# Patient Record
Sex: Male | Born: 1960 | Race: White | Hispanic: No | Marital: Single | Smoking: Never smoker
Health system: Southern US, Community
[De-identification: ages and names within clinical notes are randomized; demographics above are authoritative.]

## PROBLEM LIST (undated history)

## (undated) DIAGNOSIS — M1990 Unspecified osteoarthritis, unspecified site: Secondary | ICD-10-CM

## (undated) DIAGNOSIS — I10 Essential (primary) hypertension: Secondary | ICD-10-CM

## (undated) DIAGNOSIS — M109 Gout, unspecified: Secondary | ICD-10-CM

## (undated) HISTORY — DX: Essential (primary) hypertension: I10

## (undated) HISTORY — DX: Gout, unspecified: M10.9

## (undated) HISTORY — DX: Unspecified osteoarthritis, unspecified site: M19.90

## (undated) HISTORY — PX: CHOLECYSTECTOMY: SHX55

---

## 2005-07-04 ENCOUNTER — Emergency Department: Payer: Self-pay | Admitting: Emergency Medicine

## 2006-02-22 ENCOUNTER — Inpatient Hospital Stay: Payer: Self-pay | Admitting: Surgery

## 2006-02-22 ENCOUNTER — Other Ambulatory Visit: Payer: Self-pay

## 2006-07-24 ENCOUNTER — Other Ambulatory Visit: Payer: Self-pay

## 2006-07-24 ENCOUNTER — Inpatient Hospital Stay: Payer: Self-pay | Admitting: Surgery

## 2014-03-31 ENCOUNTER — Ambulatory Visit: Payer: Self-pay | Admitting: Internal Medicine

## 2014-04-09 ENCOUNTER — Emergency Department: Payer: Self-pay | Admitting: Emergency Medicine

## 2014-04-09 LAB — URINALYSIS, COMPLETE
BACTERIA: NONE SEEN
BLOOD: NEGATIVE
Bilirubin,UR: NEGATIVE
Glucose,UR: NEGATIVE mg/dL (ref 0–75)
Ketone: NEGATIVE
Leukocyte Esterase: NEGATIVE
NITRITE: NEGATIVE
PH: 6 (ref 4.5–8.0)
Protein: NEGATIVE
RBC,UR: 1 /HPF (ref 0–5)
Specific Gravity: 1.015 (ref 1.003–1.030)
Squamous Epithelial: NONE SEEN
WBC UR: 1 /HPF (ref 0–5)

## 2014-04-11 LAB — URINE CULTURE

## 2014-06-10 ENCOUNTER — Emergency Department: Payer: Self-pay | Admitting: Emergency Medicine

## 2014-06-11 ENCOUNTER — Emergency Department: Payer: Self-pay | Admitting: Emergency Medicine

## 2014-06-13 ENCOUNTER — Emergency Department: Payer: Self-pay | Admitting: Internal Medicine

## 2014-06-16 ENCOUNTER — Emergency Department: Payer: Self-pay | Admitting: Emergency Medicine

## 2015-05-11 ENCOUNTER — Ambulatory Visit
Admission: EM | Admit: 2015-05-11 | Discharge: 2015-05-11 | Disposition: A | Discharge status: Home / Self Care | Payer: Managed Care, Other (non HMO) | Attending: Family Medicine | Admitting: Family Medicine

## 2015-05-11 DIAGNOSIS — J101 Influenza due to other identified influenza virus with other respiratory manifestations: Secondary | ICD-10-CM

## 2015-05-11 LAB — URINALYSIS COMPLETE WITH MICROSCOPIC (ARMC ONLY)
BILIRUBIN URINE: NEGATIVE
Bacteria, UA: NONE SEEN
Glucose, UA: NEGATIVE mg/dL
KETONES UR: NEGATIVE mg/dL
LEUKOCYTES UA: NEGATIVE
NITRITE: NEGATIVE
Protein, ur: NEGATIVE mg/dL
pH: 5.5 (ref 5.0–8.0)

## 2015-05-11 LAB — RAPID INFLUENZA A&B ANTIGENS: Influenza A (ARMC): NOT DETECTED

## 2015-05-11 LAB — RAPID INFLUENZA A&B ANTIGENS (ARMC ONLY): INFLUENZA B (ARMC): DETECTED

## 2015-05-11 MED ORDER — GUAIFENESIN-CODEINE 100-10 MG/5ML PO SOLN: ORAL | Status: DC

## 2015-05-11 MED ORDER — OSELTAMIVIR PHOSPHATE 75 MG PO CAPS: 75.0000 mg | ORAL_CAPSULE | Freq: Two times a day (BID) | ORAL | Status: DC

## 2015-05-11 NOTE — ED Notes (Addendum)
Patient c/o fever, cough, nasal congestion, body aches, and chills which started Thursday.  Patient is also c/o right-sided lower back pain which has been going on for over a year.

## 2015-05-11 NOTE — ED Provider Notes (Signed)
CSN: [id number]     Arrival date & time 05/11/15  1313 History   None    Chief Complaint  Patient presents with  . Cough  . Nasal Congestion   (Consider location/radiation/quality/duration/timing/severity/associated sxs/prior Treatment) Patient is a 55 y.o. male presenting with URI. The history is provided by the patient.  URI Presenting symptoms: congestion, cough, fever and rhinorrhea   Severity:  Moderate Onset quality:  Sudden Duration:  2 days Timing:  Constant Progression:  Worsening Chronicity:  New Relieved by:  Nothing Ineffective treatments:  OTC medications Associated symptoms: headaches and myalgias   Risk factors: no chronic kidney disease, no chronic respiratory disease, no diabetes mellitus and no immunosuppression     History reviewed. No pertinent past medical history. Past Surgical History  Procedure Laterality Date  . Cholecystectomy     History reviewed. No pertinent family history. Social History  Substance Use Topics  . Smoking status: Never Smoker   . Smokeless tobacco: Never Used  . Alcohol Use: Yes     Comment: Occ.    Review of Systems  Constitutional: Positive for fever.  HENT: Positive for congestion and rhinorrhea.   Respiratory: Positive for cough.   Musculoskeletal: Positive for myalgias.  Neurological: Positive for headaches.    Allergies  Review of patient's allergies indicates no known allergies.  Home Medications   Prior to Admission medications   Medication Sig Start Date End Date Taking? Authorizing Provider  guaiFENesin-codeine 100-10 MG/5ML syrup 10 ml po q 8 hours prn cough 05/11/15   Payton Mccallum, MD  oseltamivir (TAMIFLU) 75 MG capsule Take 1 capsule (75 mg total) by mouth 2 (two) times daily. 05/11/15   Payton Mccallum, MD   Meds Ordered and Administered this Visit  Medications - No data to display  BP 133/91 mmHg  Pulse 113  Temp(Src) 99.2 F (37.3 C) (Oral)  Resp 18  Ht  (1.727 m)  Wt 214 lb (97.07 kg)   BMI 32.55 kg/m2  SpO2 98% No data found.   Physical Exam  Constitutional: He appears well-developed and well-nourished. No distress.  HENT:  Head: Normocephalic and atraumatic.  Right Ear: Tympanic membrane, external ear and ear canal normal.  Left Ear: Tympanic membrane, external ear and ear canal normal.  Nose: Rhinorrhea present.  Mouth/Throat: Uvula is midline, oropharynx is clear and moist and mucous membranes are normal. No oropharyngeal exudate or tonsillar abscesses.  Eyes: Conjunctivae and EOM are normal. Pupils are equal, round, and reactive to light. Right eye exhibits no discharge. Left eye exhibits no discharge. No scleral icterus.  Neck: Normal range of motion. Neck supple. No tracheal deviation present. No thyromegaly present.  Cardiovascular: Normal rate, regular rhythm and normal heart sounds.   Pulmonary/Chest: Effort normal and breath sounds normal. No stridor. No respiratory distress. He has no wheezes. He has no rales. He exhibits no tenderness.  Abdominal: Soft. Bowel sounds are normal. He exhibits no distension.  Lymphadenopathy:    He has no cervical adenopathy.  Neurological: He is alert.  Skin: Skin is warm and dry. No rash noted. He is not diaphoretic.  Nursing note and vitals reviewed.   ED Course  Procedures (including critical care time)  Labs Review Labs Reviewed  URINALYSIS COMPLETEWITH MICROSCOPIC ([facility] ONLY) - Abnormal; Notable for the following:    Specific Gravity, Urine >1.030 (*)    Hgb urine dipstick TRACE (*)    Squamous Epithelial / LPF 0-5 (*)    All other components within normal limits  RAPID  INFLUENZA A&B ANTIGENS ([facility] ONLY)    Imaging Review No results found.   Visual Acuity Review  Right Eye Distance:   Left Eye Distance:   Bilateral Distance:    Right Eye Near:   Left Eye Near:    Bilateral Near:         MDM   1. Influenza B     New Prescriptions   GUAIFENESIN-CODEINE 100-10 MG/5ML SYRUP    10 ml po q 8  hours prn cough   OSELTAMIVIR (TAMIFLU) 75 MG CAPSULE    Take 1 capsule (75 mg total) by mouth 2 (two) times daily.   1. Lab results and diagnosis reviewed with patient 2. rx as per orders above; reviewed possible side effects, interactions, risks and benefits  3. Recommend supportive treatment with increased fluids; otc analgesics 4. Follow-up prn if symptoms worsen or don't improve    Payton Mccallum, MD 05/11/15 1447

## 2018-01-05 ENCOUNTER — Ambulatory Visit: Payer: Self-pay

## 2018-01-17 ENCOUNTER — Ambulatory Visit: Payer: Self-pay | Admitting: Family Medicine

## 2018-01-17 ENCOUNTER — Other Ambulatory Visit: Payer: Self-pay

## 2018-01-17 VITALS — BP 157/94 | HR 92 | Ht 67.0 in | Wt 223.5 lb

## 2018-01-17 DIAGNOSIS — G8929 Other chronic pain: Secondary | ICD-10-CM

## 2018-01-17 DIAGNOSIS — R053 Chronic cough: Secondary | ICD-10-CM

## 2018-01-17 DIAGNOSIS — R0609 Other forms of dyspnea: Principal | ICD-10-CM

## 2018-01-17 DIAGNOSIS — R05 Cough: Secondary | ICD-10-CM

## 2018-01-17 DIAGNOSIS — M549 Dorsalgia, unspecified: Secondary | ICD-10-CM

## 2018-01-17 DIAGNOSIS — R030 Elevated blood-pressure reading, without diagnosis of hypertension: Secondary | ICD-10-CM | POA: Insufficient documentation

## 2018-01-17 MED ORDER — FLUTICASONE-SALMETEROL 250-50 MCG/DOSE IN AEPB: 1.0000 | INHALATION_SPRAY | Freq: Two times a day (BID) | RESPIRATORY_TRACT | 3 refills | Status: DC

## 2018-01-17 MED ORDER — DICLOFENAC SODIUM 75 MG PO TBEC: 75.0000 mg | DELAYED_RELEASE_TABLET | Freq: Two times a day (BID) | ORAL | 2 refills | Status: DC | PRN

## 2018-01-17 NOTE — Addendum Note (Signed)
Addended by: Madelin Rear A on: 01/17/2018 07:58 PM   Modules accepted: Orders
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
## 2018-01-19 ENCOUNTER — Ambulatory Visit
Admission: RE | Admit: 2018-01-19 | Discharge: 2018-01-19 | Disposition: A | Discharge status: Home / Self Care | Payer: Self-pay | Source: Ambulatory Visit | Attending: Family Medicine | Admitting: Family Medicine

## 2018-01-19 ENCOUNTER — Other Ambulatory Visit: Payer: Self-pay

## 2018-01-19 DIAGNOSIS — M549 Dorsalgia, unspecified: Secondary | ICD-10-CM

## 2018-01-19 DIAGNOSIS — R0609 Other forms of dyspnea: Principal | ICD-10-CM

## 2018-01-19 DIAGNOSIS — R030 Elevated blood-pressure reading, without diagnosis of hypertension: Secondary | ICD-10-CM

## 2018-01-19 DIAGNOSIS — R053 Chronic cough: Secondary | ICD-10-CM

## 2018-01-19 DIAGNOSIS — G8929 Other chronic pain: Secondary | ICD-10-CM

## 2018-01-19 DIAGNOSIS — R05 Cough: Secondary | ICD-10-CM

## 2018-01-20 LAB — CBC WITH DIFFERENTIAL/PLATELET
Basophils Absolute: 0.1 10*3/uL (ref 0.0–0.2)
Basos: 1 %
EOS (ABSOLUTE): 0.1 10*3/uL (ref 0.0–0.4)
Eos: 2 %
Hematocrit: 45.8 % (ref 37.5–51.0)
Hemoglobin: 15 g/dL (ref 13.0–17.7)
Immature Grans (Abs): 0 10*3/uL (ref 0.0–0.1)
Immature Granulocytes: 0 %
Lymphocytes Absolute: 1.7 10*3/uL (ref 0.7–3.1)
Lymphs: 24 %
MCH: 29.7 pg (ref 26.6–33.0)
MCHC: 32.8 g/dL (ref 31.5–35.7)
MCV: 91 fL (ref 79–97)
Monocytes Absolute: 0.6 10*3/uL (ref 0.1–0.9)
Monocytes: 8 %
Neutrophils Absolute: 4.7 10*3/uL (ref 1.4–7.0)
Neutrophils: 65 %
Platelets: 205 10*3/uL (ref 150–450)
RBC: 5.05 x10E6/uL (ref 4.14–5.80)
RDW: 12.6 % (ref 12.3–15.4)
WBC: 7.2 10*3/uL (ref 3.4–10.8)

## 2018-01-20 LAB — BRAIN NATRIURETIC PEPTIDE: BNP: 12.1 pg/mL (ref 0.0–100.0)

## 2018-01-20 LAB — COMPREHENSIVE METABOLIC PANEL
ALT: 46 IU/L — ABNORMAL HIGH (ref 0–44)
AST: 35 IU/L (ref 0–40)
Albumin/Globulin Ratio: 1.6 (ref 1.2–2.2)
Albumin: 4.2 g/dL (ref 3.5–5.5)
Alkaline Phosphatase: 66 IU/L (ref 39–117)
BUN/Creatinine Ratio: 11 (ref 9–20)
BUN: 12 mg/dL (ref 6–24)
Bilirubin Total: 0.5 mg/dL (ref 0.0–1.2)
CO2: 27 mmol/L (ref 20–29)
Calcium: 9.3 mg/dL (ref 8.7–10.2)
Chloride: 101 mmol/L (ref 96–106)
Creatinine, Ser: 1.07 mg/dL (ref 0.76–1.27)
GFR calc Af Amer: 88 mL/min/{1.73_m2} (ref >59)
GFR calc non Af Amer: 76 mL/min/{1.73_m2} (ref >59)
Globulin, Total: 2.6 g/dL (ref 1.5–4.5)
Glucose: 139 mg/dL — ABNORMAL HIGH (ref 65–99)
Potassium: 4.4 mmol/L (ref 3.5–5.2)
Sodium: 143 mmol/L (ref 134–144)
Total Protein: 6.8 g/dL (ref 6.0–8.5)

## 2018-01-20 LAB — LIPID PANEL
Chol/HDL Ratio: 4.2 ratio (ref 0.0–5.0)
Cholesterol, Total: 148 mg/dL (ref 100–199)
HDL: 35 mg/dL — ABNORMAL LOW (ref >39)
LDL Calculated: 71 mg/dL (ref 0–99)
Triglycerides: 210 mg/dL — ABNORMAL HIGH (ref 0–149)
VLDL Cholesterol Cal: 42 mg/dL — ABNORMAL HIGH (ref 5–40)

## 2018-01-24 ENCOUNTER — Other Ambulatory Visit: Payer: Self-pay | Admitting: Internal Medicine

## 2018-01-24 ENCOUNTER — Ambulatory Visit: Payer: Self-pay | Admitting: Pharmacy Technician

## 2018-01-24 DIAGNOSIS — R053 Chronic cough: Secondary | ICD-10-CM

## 2018-01-24 DIAGNOSIS — R0609 Other forms of dyspnea: Principal | ICD-10-CM

## 2018-01-24 DIAGNOSIS — Z79899 Other long term (current) drug therapy: Secondary | ICD-10-CM

## 2018-01-24 DIAGNOSIS — R05 Cough: Secondary | ICD-10-CM

## 2018-01-24 NOTE — Progress Notes (Signed)
Completed [facility] application and contract.  Patient agreed to all terms of the [facility] contract.    Patient approved to receive medication assistance at [facility] as long as eligiblity criteria continues to be met.    Provided patient with Civil engineer, contracting based on his particular needs.    Advair Prescription Application completed with patient.  Forwarded to [facility] for signature.  Upon receipt of signed application from provider, Advair Prescription Application will be submitted to [facility].  [facility]

## 2018-01-30 ENCOUNTER — Telehealth: Payer: Self-pay | Admitting: Pharmacist

## 2018-01-30 NOTE — Telephone Encounter (Signed)
01/30/2018 9:34:18 AM - Advair pending  01/30/18 patient chart on chart-holding for script from provider for Advair.Forde Radon

## 2018-01-31 ENCOUNTER — Ambulatory Visit: Payer: Self-pay | Admitting: Family Medicine

## 2018-01-31 VITALS — BP 151/98 | HR 90 | Temp 98.0°F | Ht 67.0 in | Wt 225.1 lb

## 2018-01-31 DIAGNOSIS — M5134 Other intervertebral disc degeneration, thoracic region: Secondary | ICD-10-CM

## 2018-01-31 DIAGNOSIS — Z09 Encounter for follow-up examination after completed treatment for conditions other than malignant neoplasm: Secondary | ICD-10-CM

## 2018-01-31 DIAGNOSIS — R030 Elevated blood-pressure reading, without diagnosis of hypertension: Secondary | ICD-10-CM

## 2018-01-31 DIAGNOSIS — I10 Essential (primary) hypertension: Secondary | ICD-10-CM

## 2018-01-31 DIAGNOSIS — R0600 Dyspnea, unspecified: Secondary | ICD-10-CM

## 2018-01-31 DIAGNOSIS — R0609 Other forms of dyspnea: Secondary | ICD-10-CM

## 2018-01-31 DIAGNOSIS — M1A0790 Idiopathic chronic gout, unspecified ankle and foot, without tophus (tophi): Secondary | ICD-10-CM

## 2018-01-31 MED ORDER — IBUPROFEN 800 MG PO TABS: 800.0000 mg | ORAL_TABLET | Freq: Three times a day (TID) | ORAL | 2 refills | Status: DC | PRN

## 2018-01-31 MED ORDER — COLCHICINE 0.6 MG PO TABS: 0.6000 mg | ORAL_TABLET | Freq: Two times a day (BID) | ORAL | 1 refills | Status: DC

## 2018-01-31 MED ORDER — AMLODIPINE BESYLATE 5 MG PO TABS: 5.0000 mg | ORAL_TABLET | Freq: Every day | ORAL | 3 refills | Status: DC

## 2018-01-31 NOTE — Patient Instructions (Addendum)
Ibuprofen tablets and capsules What is this medicine? IBUPROFEN (eye BYOO proe fen) is a non-steroidal anti-inflammatory drug (NSAID). It is used for dental pain, fever, headaches or migraines, osteoarthritis, rheumatoid arthritis, or painful monthly periods. It can also relieve minor aches and pains caused by a cold, flu, or sore throat. This medicine may be used for other purposes; ask your health care provider or pharmacist if you have questions. COMMON BRAND NAME(S): Advil, Advil Junior Strength, Advil Migraine, Genpril, Ibren, IBU, Midol, Midol Cramps and Body Aches, Motrin, Motrin IB, Motrin Junior Strength, Motrin Migraine Pain, Samson-8, Toxicology Saliva Collection What should I tell my health care provider before I take this medicine? They need to know if you have any of these conditions: -asthma -cigarette smoker -drink more than 3 alcohol containing drinks a day -heart disease or circulation problems such as heart failure or leg edema (fluid retention) -high blood pressure -kidney disease -liver disease -stomach bleeding or ulcers -an unusual or allergic reaction to ibuprofen, aspirin, other NSAIDS, other medicines, foods, dyes, or preservatives -pregnant or trying to get pregnant -breast-feeding How should I use this medicine? Take this medicine by mouth with a glass of water. Follow the directions on the prescription label. Take this medicine with food if your stomach gets upset. Try to not lie down for at least 10 minutes after you take the medicine. Take your medicine at regular intervals. Do not take your medicine more often than directed. A special MedGuide will be given to you by the pharmacist with each prescription and refill. Be sure to read this information carefully each time. Talk to your pediatrician regarding the use of this medicine in children. Special care may be needed. Overdosage: If you think you have taken too much of this medicine contact a poison control  center or emergency room at once. NOTE: This medicine is only for you. Do not share this medicine with others. What if I miss a dose? If you miss a dose, take it as soon as you can. If it is almost time for your next dose, take only that dose. Do not take double or extra doses. What may interact with this medicine? Do not take this medicine with any of the following medications: -cidofovir -ketorolac -methotrexate -pemetrexed This medicine may also interact with the following medications: -alcohol -aspirin -diuretics -lithium -other drugs for inflammation like prednisone -warfarin This list may not describe all possible interactions. Give your health care provider a list of all the medicines, herbs, non-prescription drugs, or dietary supplements you use. Also tell them if you smoke, drink alcohol, or use illegal drugs. Some items may interact with your medicine. What should I watch for while using this medicine? Tell your doctor or healthcare professional if your symptoms do not start to get better or if they get worse. This medicine does not prevent heart attack or stroke. In fact, this medicine may increase the chance of a heart attack or stroke. The chance may increase with longer use of this medicine and in people who have heart disease. If you take aspirin to prevent heart attack or stroke, talk with your doctor or health care professional. Do not take other medicines that contain aspirin, ibuprofen, or naproxen with this medicine. Side effects such as stomach upset, nausea, or ulcers may be more likely to occur. Many medicines available without a prescription should not be taken with this medicine. This medicine can cause ulcers and bleeding in the stomach and intestines at any time during treatment. Ulcers   and bleeding can happen without warning symptoms and can cause death. To reduce your risk, do not smoke cigarettes or drink alcohol while you are taking this medicine. You may get  drowsy or dizzy. Do not drive, use machinery, or do anything that needs mental alertness until you know how this medicine affects you. Do not stand or sit up quickly, especially if you are an older patient. This reduces the risk of dizzy or fainting spells. This medicine can cause you to bleed more easily. Try to avoid damage to your teeth and gums when you brush or floss your teeth. This medicine may be used to treat migraines. If you take migraine medicines for 10 or more days a month, your migraines may get worse. Keep a diary of headache days and medicine use. Contact your healthcare professional if your migraine attacks occur more frequently. What side effects may I notice from receiving this medicine? Side effects that you should report to your doctor or health care professional as soon as possible: -allergic reactions like skin rash, itching or hives, swelling of the face, lips, or tongue -severe stomach pain -signs and symptoms of bleeding such as bloody or black, tarry stools; red or dark-brown urine; spitting up blood or brown material that looks like coffee grounds; red spots on the skin; unusual bruising or bleeding from the eye, gums, or nose -signs and symptoms of a blood clot such as changes in vision; chest pain; severe, sudden headache; trouble speaking; sudden numbness or weakness of the face, arm, or leg -unexplained weight gain or swelling -unusually weak or tired -yellowing of eyes or skin Side effects that usually do not require medical attention (report to your doctor or health care professional if they continue or are bothersome): -bruising -diarrhea -dizziness, drowsiness -headache -nausea, vomiting This list may not describe all possible side effects. Call your doctor for medical advice about side effects. You may report side effects to FDA at 1-800-FDA-1088. Where should I keep my medicine? Keep out of the reach of children. Store at room temperature between 15 and 30  degrees C (59 and 86 degrees F). Keep container tightly closed. Throw away any unused medicine after the expiration date. NOTE: This sheet is a summary. It may not cover all possible information. If you have questions about this medicine, talk to your doctor, pharmacist, or health care provider.  2018 Elsevier/Gold Standard (2012-11-14 10:48:02)    Amlodipine tablets What is this medicine? AMLODIPINE (am LOE di peen) is a calcium-channel blocker. It affects the amount of calcium found in your heart and muscle cells. This relaxes your blood vessels, which can reduce the amount of work the heart has to do. This medicine is used to lower high blood pressure. It is also used to prevent chest pain. This medicine may be used for other purposes; ask your health care provider or pharmacist if you have questions. COMMON BRAND NAME(S): Norvasc What should I tell my health care provider before I take this medicine? They need to know if you have any of these conditions: -heart problems like heart failure or aortic stenosis -liver disease -an unusual or allergic reaction to amlodipine, other medicines, foods, dyes, or preservatives -pregnant or trying to get pregnant -breast-feeding How should I use this medicine? Take this medicine by mouth with a glass of water. Follow the directions on the prescription label. Take your medicine at regular intervals. Do not take more medicine than directed. Talk to your pediatrician regarding the use of this medicine  in children. Special care may be needed. This medicine has been used in children as young as 6. Persons over 6 years old may have a stronger reaction to this medicine and need smaller doses. Overdosage: If you think you have taken too much of this medicine contact a poison control center or emergency room at once. NOTE: This medicine is only for you. Do not share this medicine with others. What if I miss a dose? If you miss a dose, take it as soon as you  can. If it is almost time for your next dose, take only that dose. Do not take double or extra doses. What may interact with this medicine? -herbal or dietary supplements -local or general anesthetics -medicines for high blood pressure -medicines for prostate problems -rifampin This list may not describe all possible interactions. Give your health care provider a list of all the medicines, herbs, non-prescription drugs, or dietary supplements you use. Also tell them if you smoke, drink alcohol, or use illegal drugs. Some items may interact with your medicine. What should I watch for while using this medicine? Visit your doctor or health care professional for regular check ups. Check your blood pressure and pulse rate regularly. Ask your health care professional what your blood pressure and pulse rate should be, and when you should contact him or her. This medicine may make you feel confused, dizzy or lightheaded. Do not drive, use machinery, or do anything that needs mental alertness until you know how this medicine affects you. To reduce the risk of dizzy or fainting spells, do not sit or stand up quickly, especially if you are an older patient. Avoid alcoholic drinks; they can make you more dizzy. Do not suddenly stop taking amlodipine. Ask your doctor or health care professional how you can gradually reduce the dose. What side effects may I notice from receiving this medicine? Side effects that you should report to your doctor or health care professional as soon as possible: -allergic reactions like skin rash, itching or hives, swelling of the face, lips, or tongue -breathing problems -changes in vision or hearing -chest pain -fast, irregular heartbeat -swelling of legs or ankles Side effects that usually do not require medical attention (report to your doctor or health care professional if they continue or are bothersome): -dry mouth -facial flushing -nausea, vomiting -stomach gas,  pain -tired, weak -trouble sleeping This list may not describe all possible side effects. Call your doctor for medical advice about side effects. You may report side effects to FDA at 1-800-FDA-1088. Where should I keep my medicine? Keep out of the reach of children. Store at room temperature between 59 and 86 degrees F (15 and 30 degrees C). Protect from light. Keep container tightly closed. Throw away any unused medicine after the expiration date. NOTE: This sheet is a summary. It may not cover all possible information. If you have questions about this medicine, talk to your doctor, pharmacist, or health care provider.  2018 Elsevier/Gold Standard (2012-02-11 11:40:58)    Low-Purine Diet Purines are compounds that affect the level of uric acid in your body. A low-purine diet is a diet that is low in purines. Eating a low-purine diet can prevent the level of uric acid in your body from getting too high and causing gout or kidney stones or both. What do I need to know about this diet?  Choose low-purine foods. Examples of low-purine foods are listed in the next section.  Drink plenty of fluids, especially water. Fluids  can help remove uric acid from your body. Try to drink 8-16 cups (1.9-3.8 L) a day.  Limit foods high in fat, especially saturated fat, as fat makes it harder for the body to get rid of uric acid. Foods high in saturated fat include pizza, cheese, ice cream, whole milk, fried foods, and gravies. Choose foods that are lower in fat and lean sources of protein. Use olive oil when cooking as it contains healthy fats that are not high in saturated fat.  Limit alcohol. Alcohol interferes with the elimination of uric acid from your body. If you are having a gout attack, avoid all alcohol.  Keep in mind that different people's bodies react differently to different foods. You will probably learn over time which foods do or do not affect you. If you discover that a food tends to cause  your gout to flare up, avoid eating that food. You can more freely enjoy foods that do not cause problems. If you have any questions about a food item, talk to your dietitian or health care provider. Which foods are low, moderate, and high in purines? The following is a list of foods that are low, moderate, and high in purines. You can eat any amount of the foods that are low in purines. You may be able to have small amounts of foods that are moderate in purines. Ask your health care provider how much of a food moderate in purines you can have. Avoid foods high in purines. Grains  Foods low in purines: Enriched white bread, pasta, rice, cake, cornbread, popcorn.  Foods moderate in purines: Whole-grain breads and cereals, wheat germ, bran, oatmeal. Uncooked oatmeal. Dry wheat bran or wheat germ.  Foods high in purines: Pancakes, [place] toast, biscuits, muffins. Vegetables  Foods low in purines: All vegetables, except those that are moderate in purines.  Foods moderate in purines: Asparagus, cauliflower, spinach, mushrooms, green peas. Fruits  All fruits are low in purines. Meats and other Protein Foods  Foods low in purines: Eggs, nuts, peanut butter.  Foods moderate in purines: 80-90% lean beef, lamb, veal, pork, poultry, fish, eggs, peanut butter, nuts. Crab, lobster, oysters, and shrimp. Cooked dried beans, peas, and lentils.  Foods high in purines: Anchovies, sardines, herring, mussels, tuna, codfish, scallops, trout, and haddock. Gary Riley. Organ meats (such as liver or kidney). Tripe. Game meat. Goose. Sweetbreads. Dairy  All dairy foods are low in purines. Low-fat and fat-free dairy products are best because they are low in saturated fat. Beverages  Drinks low in purines: Water, carbonated beverages, tea, coffee, cocoa.  Drinks moderate in purines: Soft drinks and other drinks sweetened with high-fructose corn syrup. Juices. To find whether a food or drink is sweetened with  high-fructose corn syrup, look at the ingredients list.  Drinks high in purines: Alcoholic beverages (such as beer). Condiments  Foods low in purines: Salt, herbs, olives, pickles, relishes, vinegar.  Foods moderate in purines: Butter, margarine, oils, mayonnaise. Fats and Oils  Foods low in purines: All types, except gravies and sauces made with meat.  Foods high in purines: Gravies and sauces made with meat. Other Foods  Foods low in purines: Sugars, sweets, gelatin. Cake. Soups made without meat.  Foods moderate in purines: Meat-based or fish-based soups, broths, or bouillons. Foods and drinks sweetened with high-fructose corn syrup.  Foods high in purines: High-fat desserts (such as ice cream, cookies, cakes, pies, doughnuts, and chocolate). Contact your dietitian for more information on foods that are not listed here. This  information is not intended to replace advice given to you by your health care provider. Make sure you discuss any questions you have with your health care provider. Document Released: 07/10/2010 Document Revised: 08/21/2015 Document Reviewed: 02/19/2013 Elsevier Interactive Patient Education  2017 [employer]     Gout Gout is painful swelling that can happen in some of your joints. Gout is a type of arthritis. This condition is caused by having too much uric acid in your body. Uric acid is a chemical that is made when your body breaks down substances called purines. If your body has too much uric acid, sharp crystals can form and build up in your joints. This causes pain and swelling. Gout attacks can happen quickly and be very painful (acute gout). Over time, the attacks can affect more joints and happen more often (chronic gout). Follow these instructions at home: During a Gout Attack  If directed, put ice on the painful area: ? Put ice in a plastic bag. ? Place a towel between your skin and the bag. ? Leave the ice on for 20 minutes, 2-3 times a  day.  Rest the joint as much as possible. If the joint is in your leg, you may be given crutches to use.  Raise (elevate) the painful joint above the level of your heart as often as you can.  Drink enough fluids to keep your pee (urine) clear or pale yellow.  Take over-the-counter and prescription medicines only as told by your doctor.  Do not drive or use heavy machinery while taking prescription pain medicine.  Follow instructions from your doctor about what you can or cannot eat and drink.  Return to your normal activities as told by your doctor. Ask your doctor what activities are safe for you. Avoiding Future Gout Attacks  Follow a low-purine diet as told by a specialist (dietitian) or your doctor. Avoid foods and drinks that have a lot of purines, such as: ? Liver. ? Kidney. ? Anchovies. ? Asparagus. ? Herring. ? Mushrooms ? Mussels. ? Beer.  Limit alcohol intake to no more than 1 drink a day for nonpregnant women and 2 drinks a day for men. One drink equals 12 oz of beer, 5 oz of wine, or 1 oz of hard liquor.  Stay at a healthy weight or lose weight if you are overweight. If you want to lose weight, talk with your doctor. It is important that you do not lose weight too fast.  Start or continue an exercise plan as told by your doctor.  Drink enough fluids to keep your pee clear or pale yellow.  Take over-the-counter and prescription medicines only as told by your doctor.  Keep all follow-up visits as told by your doctor. This is important. Contact a doctor if:  You have another gout attack.  You still have symptoms of a gout attack after10 days of treatment.  You have problems (side effects) because of your medicines.  You have chills or a fever.  You have burning pain when you pee (urinate).  You have pain in your lower back or belly. Get help right away if:  You have very bad pain.  Your pain cannot be controlled.  You cannot pee. This information is  not intended to replace advice given to you by your health care provider. Make sure you discuss any questions you have with your health care provider. Document Released: 12/23/2007 Document Revised: 08/21/2015 Document Reviewed: 12/26/2014 Elsevier Interactive Patient Education  [employer].
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
## 2018-02-07 ENCOUNTER — Ambulatory Visit: Payer: Self-pay | Admitting: Licensed Clinical Social Worker

## 2018-02-07 DIAGNOSIS — Z598 Other problems related to housing and economic circumstances: Secondary | ICD-10-CM

## 2018-02-07 DIAGNOSIS — Z599 Problem related to housing and economic circumstances, unspecified: Secondary | ICD-10-CM

## 2018-02-09 NOTE — BH Specialist Note (Signed)
Primary purposes of today's session was on assisting the patient on the process of applying for charity care and food stamps.

## 2018-02-10 ENCOUNTER — Telehealth: Payer: Self-pay | Admitting: Pharmacist

## 2018-02-10 NOTE — Telephone Encounter (Signed)
02/10/2018 12:21:37 PM - Advair 250/50  02/10/18 Faxed [employer] application for Advair 250/50 Inhale 1 puff into the lungs two times a day, rinse mouth and spit after each use.Forde RadonAJ

## 2018-02-20 ENCOUNTER — Other Ambulatory Visit: Payer: Self-pay | Admitting: Internal Medicine

## 2018-02-20 DIAGNOSIS — R05 Cough: Secondary | ICD-10-CM

## 2018-02-20 DIAGNOSIS — R0609 Other forms of dyspnea: Principal | ICD-10-CM

## 2018-02-20 DIAGNOSIS — R053 Chronic cough: Secondary | ICD-10-CM

## 2018-03-06 ENCOUNTER — Other Ambulatory Visit: Payer: Self-pay

## 2018-03-06 ENCOUNTER — Encounter (INDEPENDENT_AMBULATORY_CARE_PROVIDER_SITE_OTHER): Payer: Self-pay

## 2018-03-06 ENCOUNTER — Encounter: Payer: Self-pay | Admitting: Pharmacist

## 2018-03-06 ENCOUNTER — Telehealth: Payer: Self-pay | Admitting: Pharmacist

## 2018-03-06 ENCOUNTER — Ambulatory Visit: Payer: Self-pay | Admitting: Pharmacist

## 2018-03-06 VITALS — Ht 68.0 in | Wt 228.0 lb

## 2018-03-06 DIAGNOSIS — Z79899 Other long term (current) drug therapy: Secondary | ICD-10-CM

## 2018-03-06 NOTE — Telephone Encounter (Signed)
03/06/2018 2:16:54 PM - Colcrys  03/06/18 Faxed [employer] application for Colcrys 0.6mg  Take one tablet by mouth 2 times a day.Forde RadonAJ
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
## 2018-03-09 ENCOUNTER — Ambulatory Visit: Payer: Self-pay | Admitting: Adult Health Nurse Practitioner

## 2018-03-09 DIAGNOSIS — M1A0710 Idiopathic chronic gout, right ankle and foot, without tophus (tophi): Secondary | ICD-10-CM

## 2018-03-09 DIAGNOSIS — M1990 Unspecified osteoarthritis, unspecified site: Secondary | ICD-10-CM | POA: Insufficient documentation

## 2018-03-09 DIAGNOSIS — M109 Gout, unspecified: Secondary | ICD-10-CM | POA: Insufficient documentation

## 2018-03-09 DIAGNOSIS — I10 Essential (primary) hypertension: Secondary | ICD-10-CM | POA: Insufficient documentation

## 2018-03-09 DIAGNOSIS — R0981 Nasal congestion: Secondary | ICD-10-CM

## 2018-03-09 MED ORDER — FLUTICASONE PROPIONATE 50 MCG/ACT NA SUSP: 2.0000 | Freq: Every day | NASAL | 1 refills | Status: DC

## 2018-03-09 MED ORDER — LISINOPRIL 20 MG PO TABS: 20.0000 mg | ORAL_TABLET | Freq: Every day | ORAL | 3 refills | Status: DC
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
## 2018-03-10 LAB — URIC ACID: Uric Acid: 8.6 mg/dL (ref 3.7–8.6)

## 2018-03-13 ENCOUNTER — Encounter: Payer: Self-pay | Admitting: Pharmacist

## 2018-03-30 ENCOUNTER — Telehealth: Payer: Self-pay | Admitting: Pharmacist

## 2018-03-30 NOTE — Telephone Encounter (Signed)
03/30/2018 11:52:31 AM - Advair refill  03/30/18 Placed refill online with [employer] for Advair 250/50, to ship 05/11/18, order# [id number].Forde Radon

## 2018-04-11 ENCOUNTER — Ambulatory Visit: Payer: Self-pay | Admitting: Gerontology

## 2018-04-11 ENCOUNTER — Ambulatory Visit: Payer: Self-pay | Admitting: Specialist

## 2018-04-12 ENCOUNTER — Encounter: Payer: Self-pay | Admitting: Gerontology

## 2018-04-12 ENCOUNTER — Ambulatory Visit: Payer: Self-pay | Admitting: Gerontology

## 2018-04-12 ENCOUNTER — Other Ambulatory Visit: Payer: Self-pay

## 2018-04-12 VITALS — BP 136/95 | HR 85 | Ht 68.0 in | Wt 227.5 lb

## 2018-04-12 DIAGNOSIS — G8929 Other chronic pain: Secondary | ICD-10-CM

## 2018-04-12 DIAGNOSIS — R5383 Other fatigue: Secondary | ICD-10-CM

## 2018-04-12 DIAGNOSIS — Z0000 Encounter for general adult medical examination without abnormal findings: Secondary | ICD-10-CM

## 2018-04-12 DIAGNOSIS — I10 Essential (primary) hypertension: Secondary | ICD-10-CM

## 2018-04-12 DIAGNOSIS — M549 Dorsalgia, unspecified: Secondary | ICD-10-CM

## 2018-04-12 DIAGNOSIS — R0981 Nasal congestion: Secondary | ICD-10-CM
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
## 2018-04-12 NOTE — Patient Instructions (Signed)
Back Exercises If you have pain in your back, do these exercises 2-3 times each day or as told by your doctor. When the pain goes away, do the exercises once each day, but repeat the steps more times for each exercise (do more repetitions). If you do not have pain in your back, do these exercises once each day or as told by your doctor. Exercises Single Knee to Chest Do these steps 3-5 times in a row for each leg: 1. Lie on your back on a firm bed or the floor with your legs stretched out. 2. Bring one knee to your chest. 3. Hold your knee to your chest by grabbing your knee or thigh. 4. Pull on your knee until you feel a gentle stretch in your lower back. 5. Keep doing the stretch for 10-30 seconds. 6. Slowly let go of your leg and straighten it. Pelvic Tilt Do these steps 5-10 times in a row: 1. Lie on your back on a firm bed or the floor with your legs stretched out. 2. Bend your knees so they point up to the ceiling. Your feet should be flat on the floor. 3. Tighten your lower belly (abdomen) muscles to press your lower back against the floor. This will make your tailbone point up to the ceiling instead of pointing down to your feet or the floor. 4. Stay in this position for 5-10 seconds while you gently tighten your muscles and breathe evenly. Cat-Cow Do these steps until your lower back bends more easily: 1. Get on your hands and knees on a firm surface. Keep your hands under your shoulders, and keep your knees under your hips. You may put padding under your knees. 2. Let your head hang down, and make your tailbone point down to the floor so your lower back is round like the back of a cat. 3. Stay in this position for 5 seconds. 4. Slowly lift your head and make your tailbone point up to the ceiling so your back hangs low (sags) like the back of a cow. 5. Stay in this position for 5 seconds.  Press-Ups Do these steps 5-10 times in a row: 1. Lie on your belly (face-down) on the  floor. 2. Place your hands near your head, about shoulder-width apart. 3. While you keep your back relaxed and keep your hips on the floor, slowly straighten your arms to raise the top half of your body and lift your shoulders. Do not use your back muscles. To make yourself more comfortable, you may change where you place your hands. 4. Stay in this position for 5 seconds. 5. Slowly return to lying flat on the floor.  Bridges Do these steps 10 times in a row: 1. Lie on your back on a firm surface. 2. Bend your knees so they point up to the ceiling. Your feet should be flat on the floor. 3. Tighten your butt muscles and lift your butt off of the floor until your waist is almost as high as your knees. If you do not feel the muscles working in your butt and the back of your thighs, slide your feet 1-2 inches farther away from your butt. 4. Stay in this position for 3-5 seconds. 5. Slowly lower your butt to the floor, and let your butt muscles relax. If this exercise is too easy, try doing it with your arms crossed over your chest. Belly Crunches Do these steps 5-10 times in a row: 1. Lie on your back on a   firm bed or the floor with your legs stretched out. 2. Bend your knees so they point up to the ceiling. Your feet should be flat on the floor. 3. Cross your arms over your chest. 4. Tip your chin a little bit toward your chest but do not bend your neck. 5. Tighten your belly muscles and slowly raise your chest just enough to lift your shoulder blades a tiny bit off of the floor. 6. Slowly lower your chest and your head to the floor. Back Lifts Do these steps 5-10 times in a row: 1. Lie on your belly (face-down) with your arms at your sides, and rest your forehead on the floor. 2. Tighten the muscles in your legs and your butt. 3. Slowly lift your chest off of the floor while you keep your hips on the floor. Keep the back of your head in line with the curve in your back. Look at the floor  while you do this. 4. Stay in this position for 3-5 seconds. 5. Slowly lower your chest and your face to the floor. Contact a doctor if:  Your back pain gets a lot worse when you do an exercise.  Your back pain does not lessen 2 hours after you exercise. If you have any of these problems, stop doing the exercises. Do not do them again unless your doctor says it is okay. Get help right away if:  You have sudden, very bad back pain. If this happens, stop doing the exercises. Do not do them again unless your doctor says it is okay. This information is not intended to replace advice given to you by your health care provider. Make sure you discuss any questions you have with your health care provider. Document Released: 04/17/2010 Document Revised: 12/07/2017 Document Reviewed: 05/09/2014 Elsevier Interactive Patient Education  2019 [employer] DASH Eating Plan DASH stands for "Dietary Approaches to Stop Hypertension." The DASH eating plan is a healthy eating plan that has been shown to reduce high blood pressure (hypertension). It may also reduce your risk for type 2 diabetes, heart disease, and stroke. The DASH eating plan may also help with weight loss. What are tips for following this plan?  General guidelines  Avoid eating more than 2,300 mg (milligrams) of salt (sodium) a day. If you have hypertension, you may need to reduce your sodium intake to 1,500 mg a day.  Limit alcohol intake to no more than 1 drink a day for nonpregnant women and 2 drinks a day for men. One drink equals 12 oz of beer, 5 oz of wine, or 1 oz of hard liquor.  Work with your health care provider to maintain a healthy body weight or to lose weight. Ask what an ideal weight is for you.  Get at least 30 minutes of exercise that causes your heart to beat faster (aerobic exercise) most days of the week. Activities may include walking, swimming, or biking.  Work with your health care provider or diet and nutrition  specialist (dietitian) to adjust your eating plan to your individual calorie needs. Reading food labels   Check food labels for the amount of sodium per serving. Choose foods with less than 5 percent of the Daily Value of sodium. Generally, foods with less than 300 mg of sodium per serving fit into this eating plan.  To find whole grains, look for the word "whole" as the first word in the ingredient list. Shopping  Buy products labeled as "low-sodium" or "no salt added."    Buy fresh foods. Avoid canned foods and premade or frozen meals. Cooking  Avoid adding salt when cooking. Use salt-free seasonings or herbs instead of table salt or sea salt. Check with your health care provider or pharmacist before using salt substitutes.  Do not fry foods. Cook foods using healthy methods such as baking, boiling, grilling, and broiling instead.  Cook with heart-healthy oils, such as olive, canola, soybean, or sunflower oil. Meal planning  Eat a balanced diet that includes: ? 5 or more servings of fruits and vegetables each day. At each meal, try to fill half of your plate with fruits and vegetables. ? Up to 6-8 servings of whole grains each day. ? Less than 6 oz of lean meat, poultry, or fish each day. A 3-oz serving of meat is about the same size as a deck of cards. One egg equals 1 oz. ? 2 servings of low-fat dairy each day. ? A serving of nuts, seeds, or beans 5 times each week. ? Heart-healthy fats. Healthy fats called Omega-3 fatty acids are found in foods such as flaxseeds and coldwater fish, like sardines, salmon, and mackerel.  Limit how much you eat of the following: ? Canned or prepackaged foods. ? Food that is high in trans fat, such as fried foods. ? Food that is high in saturated fat, such as fatty meat. ? Sweets, desserts, sugary drinks, and other foods with added sugar. ? Full-fat dairy products.  Do not salt foods before eating.  Try to eat at least 2 vegetarian meals each  week.  Eat more home-cooked food and less restaurant, buffet, and fast food.  When eating at a restaurant, ask that your food be prepared with less salt or no salt, if possible. What foods are recommended? The items listed may not be a complete list. Talk with your dietitian about what dietary choices are best for you. Grains Whole-grain or whole-wheat bread. Whole-grain or whole-wheat pasta. Brown rice. Oatmeal. Quinoa. Bulgur. Whole-grain and low-sodium cereals. Pita bread. Low-fat, low-sodium crackers. Whole-wheat flour tortillas. Vegetables Fresh or frozen vegetables (raw, steamed, roasted, or grilled). Low-sodium or reduced-sodium tomato and vegetable juice. Low-sodium or reduced-sodium tomato sauce and tomato paste. Low-sodium or reduced-sodium canned vegetables. Fruits All fresh, dried, or frozen fruit. Canned fruit in natural juice (without added sugar). Meat and other protein foods Skinless chicken or turkey. Ground chicken or turkey. Pork with fat trimmed off. Fish and seafood. Egg whites. Dried beans, peas, or lentils. Unsalted nuts, nut butters, and seeds. Unsalted canned beans. Lean cuts of beef with fat trimmed off. Low-sodium, lean deli meat. Dairy Low-fat (1%) or fat-free (skim) milk. Fat-free, low-fat, or reduced-fat cheeses. Nonfat, low-sodium ricotta or cottage cheese. Low-fat or nonfat yogurt. Low-fat, low-sodium cheese. Fats and oils Soft margarine without trans fats. Vegetable oil. Low-fat, reduced-fat, or light mayonnaise and salad dressings (reduced-sodium). Canola, safflower, olive, soybean, and sunflower oils. Avocado. Seasoning and other foods Herbs. Spices. Seasoning mixes without salt. Unsalted popcorn and pretzels. Fat-free sweets. What foods are not recommended? The items listed may not be a complete list. Talk with your dietitian about what dietary choices are best for you. Grains Baked goods made with fat, such as croissants, muffins, or some breads. Dry pasta  or rice meal packs. Vegetables Creamed or fried vegetables. Vegetables in a cheese sauce. Regular canned vegetables (not low-sodium or reduced-sodium). Regular canned tomato sauce and paste (not low-sodium or reduced-sodium). Regular tomato and vegetable juice (not low-sodium or reduced-sodium). Pickles. Olives. Fruits Canned fruit in a   light or heavy syrup. Fried fruit. Fruit in cream or butter sauce. Meat and other protein foods Fatty cuts of meat. Ribs. Fried meat. Bacon. Sausage. Bologna and other processed lunch meats. Salami. Fatback. Hotdogs. Bratwurst. Salted nuts and seeds. Canned beans with added salt. Canned or smoked fish. Whole eggs or egg yolks. Chicken or turkey with skin. Dairy Whole or 2% milk, cream, and half-and-half. Whole or full-fat cream cheese. Whole-fat or sweetened yogurt. Full-fat cheese. Nondairy creamers. Whipped toppings. Processed cheese and cheese spreads. Fats and oils Butter. Stick margarine. Lard. Shortening. Ghee. Bacon fat. Tropical oils, such as coconut, palm kernel, or palm oil. Seasoning and other foods Salted popcorn and pretzels. Onion salt, garlic salt, seasoned salt, table salt, and sea salt. Worcestershire sauce. Tartar sauce. Barbecue sauce. Teriyaki sauce. Soy sauce, including reduced-sodium. Steak sauce. Canned and packaged gravies. Fish sauce. Oyster sauce. Cocktail sauce. Horseradish that you find on the shelf. Ketchup. Mustard. Meat flavorings and tenderizers. Bouillon cubes. Hot sauce and Tabasco sauce. Premade or packaged marinades. Premade or packaged taco seasonings. Relishes. Regular salad dressings. Where to find more information:  National Heart, Lung, and Blood Institute: www.nhlbi.nih.gov  American Heart Association: www.heart.org Summary  The DASH eating plan is a healthy eating plan that has been shown to reduce high blood pressure (hypertension). It may also reduce your risk for type 2 diabetes, heart disease, and stroke.  With the  DASH eating plan, you should limit salt (sodium) intake to 2,300 mg a day. If you have hypertension, you may need to reduce your sodium intake to 1,500 mg a day.  When on the DASH eating plan, aim to eat more fresh fruits and vegetables, whole grains, lean proteins, low-fat dairy, and heart-healthy fats.  Work with your health care provider or diet and nutrition specialist (dietitian) to adjust your eating plan to your individual calorie needs. This information is not intended to replace advice given to you by your health care provider. Make sure you discuss any questions you have with your health care provider. Document Released: 03/04/2011 Document Revised: 03/08/2016 Document Reviewed: 03/08/2016 Elsevier Interactive Patient Education  2019 [employer]  

## 2018-04-13 LAB — TESTOSTERONE: Testosterone: 296 ng/dL (ref 264–916)

## 2018-04-13 LAB — SPECIMEN STATUS REPORT

## 2018-04-13 LAB — TSH: TSH: 2.23 u[IU]/mL (ref 0.450–4.500)

## 2018-04-13 NOTE — Addendum Note (Signed)
Addended by: Benjamin Stain on: 04/13/2018 03:04 PM   Modules accepted: Orders

## 2018-04-15 LAB — FECAL OCCULT BLOOD, IMMUNOCHEMICAL: Fecal Occult Bld: POSITIVE — AB

## 2018-04-18 ENCOUNTER — Other Ambulatory Visit: Payer: Self-pay | Admitting: Gerontology

## 2018-04-18 DIAGNOSIS — Z1211 Encounter for screening for malignant neoplasm of colon: Secondary | ICD-10-CM

## 2018-04-19 ENCOUNTER — Other Ambulatory Visit: Payer: Self-pay

## 2018-04-19 NOTE — Progress Notes (Unsigned)
Order colonoscopy 

## 2018-05-09 ENCOUNTER — Ambulatory Visit: Payer: Self-pay | Admitting: Specialist

## 2018-05-09 ENCOUNTER — Other Ambulatory Visit: Payer: Self-pay

## 2018-05-09 DIAGNOSIS — M1990 Unspecified osteoarthritis, unspecified site: Secondary | ICD-10-CM

## 2018-05-09 DIAGNOSIS — M1A0710 Idiopathic chronic gout, right ankle and foot, without tophus (tophi): Secondary | ICD-10-CM

## 2018-05-09 DIAGNOSIS — R053 Chronic cough: Secondary | ICD-10-CM

## 2018-05-09 DIAGNOSIS — R0609 Other forms of dyspnea: Principal | ICD-10-CM

## 2018-05-09 DIAGNOSIS — R05 Cough: Secondary | ICD-10-CM

## 2018-05-09 DIAGNOSIS — G8929 Other chronic pain: Secondary | ICD-10-CM

## 2018-05-09 MED ORDER — FLUTICASONE-SALMETEROL 250-50 MCG/DOSE IN AEPB: INHALATION_SPRAY | RESPIRATORY_TRACT | 0 refills | Status: DC

## 2018-05-09 MED ORDER — FLUTICASONE PROPIONATE 50 MCG/ACT NA SUSP: 2.0000 | Freq: Every day | NASAL | 1 refills | Status: DC

## 2018-05-09 NOTE — Progress Notes (Signed)
  Subjective:     Patient ID: Gary Riley, male   DOB: 03-May-1960, 58 y.o.   MRN: [id number]  HPI 58 year old with several history axial LBP. He has been x Anderson'ed in the past but does not recall the findings. His main pain is at night when trying to sleep., He has burning in his feet but this is not associated with his low back.   Review of Systems     Objective:   Physical Exam His gait is deliberate but antalagic. He declines to walk on his toes because it would put pressure on his back. He is able to heel walk. He is able to accomplish tandem gait. He really cannot do a unassisted squat.  He complains on an area of numbness across lumbar spine but to a shape stimulus he is actually hyperstimulus.   He has not TTP. He is able to march  In place with normal muscle recruitment and relaxation. He is able to stand on each leg independently with a neg Trendelenburg sign. Lateral bending is 25 deg with contra lateral pain at the end range. He has 50 deg of froward flextion with his fingers at the level at his knees. Extension < 10 deg with pain. Active KEMP's test cause contralateral pain neg to left and pos to the right. DTR's 2+/=. 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 Toe signs downward neg clonus. Sensation intact. MMT 5/5 seated SLR neg at 90 deg.    Diagnosis: Nonspecific LBP Assessment:        Plan:    He is on Volteren which helps and that is the probably the best we have that can help currently. He should continue activity modification and the HEP.

## 2018-05-16 ENCOUNTER — Encounter: Payer: Self-pay | Admitting: Gerontology

## 2018-05-16 ENCOUNTER — Other Ambulatory Visit: Payer: Self-pay

## 2018-05-16 ENCOUNTER — Ambulatory Visit: Payer: Self-pay | Admitting: Gerontology

## 2018-05-16 VITALS — BP 156/101 | HR 95 | Temp 98.0°F | Ht 68.0 in | Wt 223.5 lb

## 2018-05-16 DIAGNOSIS — R059 Cough, unspecified: Secondary | ICD-10-CM

## 2018-05-16 DIAGNOSIS — H6122 Impacted cerumen, left ear: Secondary | ICD-10-CM

## 2018-05-16 DIAGNOSIS — I10 Essential (primary) hypertension: Secondary | ICD-10-CM

## 2018-05-16 DIAGNOSIS — R05 Cough: Secondary | ICD-10-CM

## 2018-05-16 MED ORDER — CARBAMIDE PEROXIDE 6.5 % OT SOLN: 5.0000 [drp] | Freq: Once | OTIC | Status: DC

## 2018-05-16 MED ORDER — BENZONATATE 100 MG PO CAPS: 100.0000 mg | ORAL_CAPSULE | Freq: Three times a day (TID) | ORAL | 0 refills | Status: DC | PRN

## 2018-05-16 MED ORDER — AMLODIPINE BESYLATE 5 MG PO TABS: 10.0000 mg | ORAL_TABLET | Freq: Every day | ORAL | 3 refills | Status: DC

## 2018-05-16 MED ORDER — SALINE SPRAY 0.65 % NA SOLN: 1.0000 | NASAL | 0 refills | Status: DC | PRN

## 2018-05-16 NOTE — Patient Instructions (Signed)
DASH Eating Plan  DASH stands for "Dietary Approaches to Stop Hypertension." The DASH eating plan is a healthy eating plan that has been shown to reduce high blood pressure (hypertension). It may also reduce your risk for type 2 diabetes, heart disease, and stroke. The DASH eating plan may also help with weight loss.  What are tips for following this plan?    General guidelines   Avoid eating more than 2,300 mg (milligrams) of salt (sodium) a day. If you have hypertension, you may need to reduce your sodium intake to 1,500 mg a day.   Limit alcohol intake to no more than 1 drink a day for nonpregnant women and 2 drinks a day for men. One drink equals 12 oz of beer, 5 oz of wine, or 1 oz of hard liquor.   Work with your health care provider to maintain a healthy body weight or to lose weight. Ask what an ideal weight is for you.   Get at least 30 minutes of exercise that causes your heart to beat faster (aerobic exercise) most days of the week. Activities may include walking, swimming, or biking.   Work with your health care provider or diet and nutrition specialist (dietitian) to adjust your eating plan to your individual calorie needs.  Reading food labels     Check food labels for the amount of sodium per serving. Choose foods with less than 5 percent of the Daily Value of sodium. Generally, foods with less than 300 mg of sodium per serving fit into this eating plan.   To find whole grains, look for the word "whole" as the first word in the ingredient list.  Shopping   Buy products labeled as "low-sodium" or "no salt added."   Buy fresh foods. Avoid canned foods and premade or frozen meals.  Cooking   Avoid adding salt when cooking. Use salt-free seasonings or herbs instead of table salt or sea salt. Check with your health care provider or pharmacist before using salt substitutes.   Do not fry foods. Cook foods using healthy methods such as baking, boiling, grilling, and broiling instead.   Cook with  heart-healthy oils, such as olive, canola, soybean, or sunflower oil.  Meal planning   Eat a balanced diet that includes:  ? 5 or more servings of fruits and vegetables each day. At each meal, try to fill half of your plate with fruits and vegetables.  ? Up to 6-8 servings of whole grains each day.  ? Less than 6 oz of lean meat, poultry, or fish each day. A 3-oz serving of meat is about the same size as a deck of cards. One egg equals 1 oz.  ? 2 servings of low-fat dairy each day.  ? A serving of nuts, seeds, or beans 5 times each week.  ? Heart-healthy fats. Healthy fats called Omega-3 fatty acids are found in foods such as flaxseeds and coldwater fish, like sardines, salmon, and mackerel.   Limit how much you eat of the following:  ? Canned or prepackaged foods.  ? Food that is high in trans fat, such as fried foods.  ? Food that is high in saturated fat, such as fatty meat.  ? Sweets, desserts, sugary drinks, and other foods with added sugar.  ? Full-fat dairy products.   Do not salt foods before eating.   Try to eat at least 2 vegetarian meals each week.   Eat more home-cooked food and less restaurant, buffet, and fast food.     When eating at a restaurant, ask that your food be prepared with less salt or no salt, if possible.  What foods are recommended?  The items listed may not be a complete list. Talk with your dietitian about what dietary choices are best for you.  Grains  Whole-grain or whole-wheat bread. Whole-grain or whole-wheat pasta. Brown rice. Oatmeal. Quinoa. Bulgur. Whole-grain and low-sodium cereals. Pita bread. Low-fat, low-sodium crackers. Whole-wheat flour tortillas.  Vegetables  Fresh or frozen vegetables (raw, steamed, roasted, or grilled). Low-sodium or reduced-sodium tomato and vegetable juice. Low-sodium or reduced-sodium tomato sauce and tomato paste. Low-sodium or reduced-sodium canned vegetables.  Fruits  All fresh, dried, or frozen fruit. Canned fruit in natural juice (without  added sugar).  Meat and other protein foods  Skinless chicken or turkey. Ground chicken or turkey. Pork with fat trimmed off. Fish and seafood. Egg whites. Dried beans, peas, or lentils. Unsalted nuts, nut butters, and seeds. Unsalted canned beans. Lean cuts of beef with fat trimmed off. Low-sodium, lean deli meat.  Dairy  Low-fat (1%) or fat-free (skim) milk. Fat-free, low-fat, or reduced-fat cheeses. Nonfat, low-sodium ricotta or cottage cheese. Low-fat or nonfat yogurt. Low-fat, low-sodium cheese.  Fats and oils  Soft margarine without trans fats. Vegetable oil. Low-fat, reduced-fat, or light mayonnaise and salad dressings (reduced-sodium). Canola, safflower, olive, soybean, and sunflower oils. Avocado.  Seasoning and other foods  Herbs. Spices. Seasoning mixes without salt. Unsalted popcorn and pretzels. Fat-free sweets.  What foods are not recommended?  The items listed may not be a complete list. Talk with your dietitian about what dietary choices are best for you.  Grains  Baked goods made with fat, such as croissants, muffins, or some breads. Dry pasta or rice meal packs.  Vegetables  Creamed or fried vegetables. Vegetables in a cheese sauce. Regular canned vegetables (not low-sodium or reduced-sodium). Regular canned tomato sauce and paste (not low-sodium or reduced-sodium). Regular tomato and vegetable juice (not low-sodium or reduced-sodium). Pickles. Olives.  Fruits  Canned fruit in a light or heavy syrup. Fried fruit. Fruit in cream or butter sauce.  Meat and other protein foods  Fatty cuts of meat. Ribs. Fried meat. Bacon. Sausage. Bologna and other processed lunch meats. Salami. Fatback. Hotdogs. Bratwurst. Salted nuts and seeds. Canned beans with added salt. Canned or smoked fish. Whole eggs or egg yolks. Chicken or turkey with skin.  Dairy  Whole or 2% milk, cream, and half-and-half. Whole or full-fat cream cheese. Whole-fat or sweetened yogurt. Full-fat cheese. Nondairy creamers. Whipped toppings.  Processed cheese and cheese spreads.  Fats and oils  Butter. Stick margarine. Lard. Shortening. Ghee. Bacon fat. Tropical oils, such as coconut, palm kernel, or palm oil.  Seasoning and other foods  Salted popcorn and pretzels. Onion salt, garlic salt, seasoned salt, table salt, and sea salt. Worcestershire sauce. Tartar sauce. Barbecue sauce. Teriyaki sauce. Soy sauce, including reduced-sodium. Steak sauce. Canned and packaged gravies. Fish sauce. Oyster sauce. Cocktail sauce. Horseradish that you find on the shelf. Ketchup. Mustard. Meat flavorings and tenderizers. Bouillon cubes. Hot sauce and Tabasco sauce. Premade or packaged marinades. Premade or packaged taco seasonings. Relishes. Regular salad dressings.  Where to find more information:   National Heart, Lung, and Blood Institute: www.nhlbi.nih.gov   American Heart Association: www.heart.org  Summary   The DASH eating plan is a healthy eating plan that has been shown to reduce high blood pressure (hypertension). It may also reduce your risk for type 2 diabetes, heart disease, and stroke.   With the   DASH eating plan, you should limit salt (sodium) intake to 2,300 mg a day. If you have hypertension, you may need to reduce your sodium intake to 1,500 mg a day.   When on the DASH eating plan, aim to eat more fresh fruits and vegetables, whole grains, lean proteins, low-fat dairy, and heart-healthy fats.   Work with your health care provider or diet and nutrition specialist (dietitian) to adjust your eating plan to your individual calorie needs.  This information is not intended to replace advice given to you by your health care provider. Make sure you discuss any questions you have with your health care provider.  Document Released: 03/04/2011 Document Revised: 03/08/2016 Document Reviewed: 03/08/2016  Elsevier Interactive Patient Education  2019 [employer]
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
## 2018-05-17 ENCOUNTER — Other Ambulatory Visit: Payer: Self-pay

## 2018-05-17 DIAGNOSIS — Z0000 Encounter for general adult medical examination without abnormal findings: Secondary | ICD-10-CM

## 2018-05-25 LAB — URINALYSIS
Bilirubin, UA: NEGATIVE
Glucose, UA: NEGATIVE
Ketones, UA: NEGATIVE
Leukocytes, UA: NEGATIVE
Nitrite, UA: NEGATIVE
PROTEIN UA: NEGATIVE
RBC UA: NEGATIVE
SPEC GRAV UA: 1.021 (ref 1.005–1.030)
Urobilinogen, Ur: 0.2 mg/dL (ref 0.2–1.0)
pH, UA: 5 (ref 5.0–7.5)

## 2018-05-25 LAB — CBC WITH DIFFERENTIAL/PLATELET
BASOS: 1 %
Basophils Absolute: 0.1 10*3/uL (ref 0.0–0.2)
EOS (ABSOLUTE): 0.2 10*3/uL (ref 0.0–0.4)
Eos: 3 %
HEMATOCRIT: 41.8 % (ref 37.5–51.0)
Hemoglobin: 14.5 g/dL (ref 13.0–17.7)
Immature Grans (Abs): 0 10*3/uL (ref 0.0–0.1)
Immature Granulocytes: 0 %
Lymphocytes Absolute: 1.6 10*3/uL (ref 0.7–3.1)
Lymphs: 21 %
MCH: 30.9 pg (ref 26.6–33.0)
MCHC: 34.7 g/dL (ref 31.5–35.7)
MCV: 89 fL (ref 79–97)
Monocytes Absolute: 0.7 10*3/uL (ref 0.1–0.9)
Monocytes: 9 %
Neutrophils Absolute: 5 10*3/uL (ref 1.4–7.0)
Neutrophils: 66 %
Platelets: 208 10*3/uL (ref 150–450)
RBC: 4.7 x10E6/uL (ref 4.14–5.80)
RDW: 12.4 % (ref 11.6–15.4)
WBC: 7.6 10*3/uL (ref 3.4–10.8)

## 2018-05-25 LAB — LIPID PANEL

## 2018-05-25 LAB — COMPREHENSIVE METABOLIC PANEL

## 2018-05-25 LAB — HEMOGLOBIN A1C
Est. average glucose Bld gHb Est-mCnc: 126 mg/dL
Hgb A1c MFr Bld: 6 % — ABNORMAL HIGH (ref 4.8–5.6)

## 2018-05-31 ENCOUNTER — Other Ambulatory Visit: Payer: Self-pay

## 2018-05-31 DIAGNOSIS — I10 Essential (primary) hypertension: Secondary | ICD-10-CM

## 2018-06-01 LAB — COMPREHENSIVE METABOLIC PANEL
ALT: 67 IU/L — ABNORMAL HIGH (ref 0–44)
AST: 47 IU/L — ABNORMAL HIGH (ref 0–40)
Albumin/Globulin Ratio: 1.4 (ref 1.2–2.2)
Albumin: 4.3 g/dL (ref 3.8–4.9)
Alkaline Phosphatase: 70 IU/L (ref 39–117)
BUN/Creatinine Ratio: 9 (ref 9–20)
BUN: 11 mg/dL (ref 6–24)
Bilirubin Total: 0.7 mg/dL (ref 0.0–1.2)
CO2: 20 mmol/L (ref 20–29)
Calcium: 9.3 mg/dL (ref 8.7–10.2)
Chloride: 100 mmol/L (ref 96–106)
Creatinine, Ser: 1.19 mg/dL (ref 0.76–1.27)
GFR calc Af Amer: 77 mL/min/{1.73_m2} (ref >59)
GFR, EST NON AFRICAN AMERICAN: 66 mL/min/{1.73_m2} (ref >59)
GLOBULIN, TOTAL: 3 g/dL (ref 1.5–4.5)
Glucose: 113 mg/dL — ABNORMAL HIGH (ref 65–99)
Potassium: 4.5 mmol/L (ref 3.5–5.2)
SODIUM: 139 mmol/L (ref 134–144)
Total Protein: 7.3 g/dL (ref 6.0–8.5)

## 2018-06-01 LAB — URINALYSIS, ROUTINE W REFLEX MICROSCOPIC
Bilirubin, UA: NEGATIVE
Glucose, UA: NEGATIVE
Leukocytes, UA: NEGATIVE
Nitrite, UA: NEGATIVE
Protein, UA: NEGATIVE
RBC, UA: NEGATIVE
Specific Gravity, UA: 1.02 (ref 1.005–1.030)
UUROB: 1 mg/dL (ref 0.2–1.0)
pH, UA: 5 (ref 5.0–7.5)

## 2018-06-01 LAB — LIPID PANEL
CHOLESTEROL TOTAL: 177 mg/dL (ref 100–199)
Chol/HDL Ratio: 4.8 ratio (ref 0.0–5.0)
HDL: 37 mg/dL — ABNORMAL LOW (ref >39)
LDL Calculated: 98 mg/dL (ref 0–99)
Triglycerides: 211 mg/dL — ABNORMAL HIGH (ref 0–149)
VLDL Cholesterol Cal: 42 mg/dL — ABNORMAL HIGH (ref 5–40)

## 2018-06-05 ENCOUNTER — Telehealth: Payer: Self-pay | Admitting: Pharmacist

## 2018-06-05 NOTE — Telephone Encounter (Signed)
06/05/2018 12:14:29 PM - Colcrys refill  06/05/2018 Called [facility] for refill on [facility]-spoke with Nadene, they had patient name and dob incorrect-Mccallister, Oval 03/08/1981--She has CORRECTED to [place], Jjuan [birth year], she also placed refill.Forde Radon

## 2018-06-05 NOTE — Telephone Encounter (Signed)
06/05/2018 1:35:37 PM - Advair refill  06/05/2018 Placed refill for Advair 250/50 online with [facility], to ship 07/13/2018, order# [id number].Gary Riley

## 2018-06-12 ENCOUNTER — Other Ambulatory Visit: Payer: Self-pay | Admitting: Adult Health Nurse Practitioner

## 2018-07-12 ENCOUNTER — Other Ambulatory Visit: Payer: Self-pay

## 2018-07-12 ENCOUNTER — Encounter: Payer: Self-pay | Admitting: Gerontology

## 2018-07-12 ENCOUNTER — Ambulatory Visit: Payer: Self-pay | Admitting: Gerontology

## 2018-07-12 DIAGNOSIS — M549 Dorsalgia, unspecified: Secondary | ICD-10-CM

## 2018-07-12 DIAGNOSIS — I10 Essential (primary) hypertension: Secondary | ICD-10-CM

## 2018-07-12 DIAGNOSIS — R0609 Other forms of dyspnea: Secondary | ICD-10-CM

## 2018-07-12 DIAGNOSIS — G8929 Other chronic pain: Secondary | ICD-10-CM

## 2018-07-12 MED ORDER — DICLOFENAC SODIUM 75 MG PO TBEC: 50.0000 mg | DELAYED_RELEASE_TABLET | Freq: Two times a day (BID) | ORAL | 1 refills | Status: DC

## 2018-07-12 MED ORDER — FLUTICASONE-SALMETEROL 250-50 MCG/DOSE IN AEPB: INHALATION_SPRAY | RESPIRATORY_TRACT | 1 refills | Status: DC

## 2018-07-12 MED ORDER — AMLODIPINE BESYLATE 5 MG PO TABS: 10.0000 mg | ORAL_TABLET | Freq: Every day | ORAL | 3 refills | Status: AC

## 2018-07-12 MED ORDER — FLUTICASONE PROPIONATE 50 MCG/ACT NA SUSP: 2.0000 | Freq: Every day | NASAL | 1 refills | Status: DC

## 2018-07-12 MED ORDER — SALINE SPRAY 0.65 % NA SOLN: 1.0000 | NASAL | 1 refills | Status: DC | PRN
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
## 2018-08-10 ENCOUNTER — Other Ambulatory Visit: Payer: Self-pay

## 2018-08-10 ENCOUNTER — Ambulatory Visit: Payer: Self-pay | Admitting: Gerontology

## 2018-08-10 ENCOUNTER — Encounter: Payer: Self-pay | Admitting: Gerontology

## 2018-08-10 DIAGNOSIS — R748 Abnormal levels of other serum enzymes: Secondary | ICD-10-CM

## 2018-08-10 DIAGNOSIS — G8929 Other chronic pain: Secondary | ICD-10-CM

## 2018-08-10 DIAGNOSIS — I10 Essential (primary) hypertension: Secondary | ICD-10-CM

## 2018-08-10 DIAGNOSIS — R0609 Other forms of dyspnea: Secondary | ICD-10-CM
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
## 2018-08-24 ENCOUNTER — Telehealth: Payer: Self-pay | Admitting: Pharmacist

## 2018-08-24 NOTE — Telephone Encounter (Signed)
08/24/2018 9:07:38 AM - Advair 250/50 refill  08/24/2018 Placed refill online with [facility] for Advair 250/50, to ship 09/06/2018, order# [id number].Forde Radon

## 2018-08-30 ENCOUNTER — Other Ambulatory Visit: Payer: Self-pay

## 2018-08-31 ENCOUNTER — Other Ambulatory Visit: Payer: Self-pay

## 2018-08-31 DIAGNOSIS — R748 Abnormal levels of other serum enzymes: Secondary | ICD-10-CM

## 2018-08-31 DIAGNOSIS — I10 Essential (primary) hypertension: Secondary | ICD-10-CM

## 2018-09-01 LAB — URINALYSIS
Bilirubin, UA: NEGATIVE
Glucose, UA: NEGATIVE
Ketones, UA: NEGATIVE
Leukocytes,UA: NEGATIVE
Nitrite, UA: NEGATIVE
Protein,UA: NEGATIVE
RBC, UA: NEGATIVE
Specific Gravity, UA: 1.021 (ref 1.005–1.030)
Urobilinogen, Ur: 0.2 mg/dL (ref 0.2–1.0)
pH, UA: 5.5 (ref 5.0–7.5)

## 2018-09-01 LAB — COMPREHENSIVE METABOLIC PANEL
ALT: 37 IU/L (ref 0–44)
AST: 34 IU/L (ref 0–40)
Albumin/Globulin Ratio: 1.6 (ref 1.2–2.2)
Albumin: 4.4 g/dL (ref 3.8–4.9)
Alkaline Phosphatase: 87 IU/L (ref 39–117)
BUN/Creatinine Ratio: 9 (ref 9–20)
BUN: 12 mg/dL (ref 6–24)
Bilirubin Total: 0.4 mg/dL (ref 0.0–1.2)
CO2: 24 mmol/L (ref 20–29)
Calcium: 9.7 mg/dL (ref 8.7–10.2)
Chloride: 99 mmol/L (ref 96–106)
Creatinine, Ser: 1.32 mg/dL — ABNORMAL HIGH (ref 0.76–1.27)
GFR calc Af Amer: 68 mL/min/{1.73_m2} (ref >59)
GFR calc non Af Amer: 59 mL/min/{1.73_m2} — ABNORMAL LOW (ref >59)
Globulin, Total: 2.7 g/dL (ref 1.5–4.5)
Glucose: 91 mg/dL (ref 65–99)
Potassium: 4.1 mmol/L (ref 3.5–5.2)
Sodium: 139 mmol/L (ref 134–144)
Total Protein: 7.1 g/dL (ref 6.0–8.5)

## 2018-09-07 ENCOUNTER — Ambulatory Visit: Payer: Self-pay | Admitting: Gerontology

## 2018-09-07 ENCOUNTER — Other Ambulatory Visit: Payer: Self-pay

## 2018-09-07 DIAGNOSIS — R0609 Other forms of dyspnea: Secondary | ICD-10-CM

## 2018-09-07 DIAGNOSIS — I10 Essential (primary) hypertension: Secondary | ICD-10-CM

## 2018-09-07 DIAGNOSIS — R899 Unspecified abnormal finding in specimens from other organs, systems and tissues: Secondary | ICD-10-CM | POA: Insufficient documentation

## 2018-09-07 DIAGNOSIS — G8929 Other chronic pain: Secondary | ICD-10-CM

## 2018-09-07 MED ORDER — DICLOFENAC SODIUM 75 MG PO TBEC: 75.0000 mg | DELAYED_RELEASE_TABLET | Freq: Two times a day (BID) | ORAL | 1 refills | Status: DC | PRN

## 2018-09-07 MED ORDER — BLOOD PRESSURE KIT: 1.0000 | PACK | Freq: Every morning | 0 refills | Status: AC

## 2018-09-07 NOTE — Patient Instructions (Signed)
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
## 2018-09-14 ENCOUNTER — Telehealth: Payer: Self-pay | Admitting: Pharmacist

## 2018-09-14 NOTE — Telephone Encounter (Signed)
09/14/2018 8:35:57 AM - Colcrys refill  09/14/2018 I called [facility] for refill on Colcrys 0.6mg , patient has 2 refills left.Gary Riley

## 2018-10-11 ENCOUNTER — Other Ambulatory Visit: Payer: Self-pay

## 2018-10-11 DIAGNOSIS — I10 Essential (primary) hypertension: Secondary | ICD-10-CM

## 2018-10-12 LAB — HEMOGLOBIN A1C
Est. average glucose Bld gHb Est-mCnc: 126 mg/dL
Hgb A1c MFr Bld: 6 % — ABNORMAL HIGH (ref 4.8–5.6)

## 2018-10-12 LAB — COMPREHENSIVE METABOLIC PANEL
ALT: 30 IU/L (ref 0–44)
AST: 24 IU/L (ref 0–40)
Albumin/Globulin Ratio: 1.6 (ref 1.2–2.2)
Albumin: 4.4 g/dL (ref 3.8–4.9)
Alkaline Phosphatase: 81 IU/L (ref 39–117)
BUN/Creatinine Ratio: 15 (ref 9–20)
BUN: 18 mg/dL (ref 6–24)
Bilirubin Total: 0.5 mg/dL (ref 0.0–1.2)
CO2: 23 mmol/L (ref 20–29)
Calcium: 9.4 mg/dL (ref 8.7–10.2)
Chloride: 101 mmol/L (ref 96–106)
Creatinine, Ser: 1.24 mg/dL (ref 0.76–1.27)
GFR calc Af Amer: 73 mL/min/{1.73_m2} (ref >59)
GFR calc non Af Amer: 63 mL/min/{1.73_m2} (ref >59)
Globulin, Total: 2.8 g/dL (ref 1.5–4.5)
Glucose: 120 mg/dL — ABNORMAL HIGH (ref 65–99)
Potassium: 4.4 mmol/L (ref 3.5–5.2)
Sodium: 141 mmol/L (ref 134–144)
Total Protein: 7.2 g/dL (ref 6.0–8.5)

## 2018-10-12 LAB — LIPID PANEL
Chol/HDL Ratio: 5 ratio (ref 0.0–5.0)
Cholesterol, Total: 171 mg/dL (ref 100–199)
HDL: 34 mg/dL — ABNORMAL LOW (ref >39)
LDL Calculated: 87 mg/dL (ref 0–99)
Triglycerides: 251 mg/dL — ABNORMAL HIGH (ref 0–149)
VLDL Cholesterol Cal: 50 mg/dL — ABNORMAL HIGH (ref 5–40)

## 2018-10-17 ENCOUNTER — Encounter: Payer: Self-pay | Admitting: Gerontology

## 2018-10-17 ENCOUNTER — Ambulatory Visit: Payer: Self-pay | Admitting: Gerontology

## 2018-10-17 ENCOUNTER — Other Ambulatory Visit: Payer: Self-pay

## 2018-10-17 VITALS — BP 133/90 | HR 81 | Ht 67.5 in | Wt 220.0 lb

## 2018-10-17 DIAGNOSIS — I10 Essential (primary) hypertension: Secondary | ICD-10-CM

## 2018-10-17 DIAGNOSIS — R899 Unspecified abnormal finding in specimens from other organs, systems and tissues: Secondary | ICD-10-CM

## 2018-10-17 DIAGNOSIS — R7303 Prediabetes: Secondary | ICD-10-CM

## 2018-10-17 DIAGNOSIS — R0609 Other forms of dyspnea: Secondary | ICD-10-CM

## 2018-10-17 MED ORDER — SALINE SPRAY 0.65 % NA SOLN: 1.0000 | NASAL | 2 refills | Status: AC | PRN

## 2018-10-17 MED ORDER — METFORMIN HCL 1000 MG PO TABS: 500.0000 mg | ORAL_TABLET | Freq: Every day | ORAL | 0 refills | Status: DC

## 2018-10-17 MED ORDER — FLUTICASONE PROPIONATE 50 MCG/ACT NA SUSP: 2.0000 | Freq: Every day | NASAL | 1 refills | Status: AC

## 2018-10-17 MED ORDER — FLUTICASONE-SALMETEROL 250-50 MCG/DOSE IN AEPB: INHALATION_SPRAY | RESPIRATORY_TRACT | 1 refills | Status: AC
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
## 2018-10-17 NOTE — Patient Instructions (Signed)
Carbohydrate Counting for Diabetes Mellitus, Adult  Carbohydrate counting is a method of keeping track of how many carbohydrates you eat. Eating carbohydrates naturally increases the amount of sugar (glucose) in the blood. Counting how many carbohydrates you eat helps keep your blood glucose within normal limits, which helps you manage your diabetes (diabetes mellitus). It is important to know how many carbohydrates you can safely have in each meal. This is different for every person. A diet and nutrition specialist (registered dietitian) can help you make a meal plan and calculate how many carbohydrates you should have at each meal and snack. Carbohydrates are found in the following foods:  Grains, such as breads and cereals.  Dried beans and soy products.  Starchy vegetables, such as potatoes, peas, and corn.  Fruit and fruit juices.  Milk and yogurt.  Sweets and snack foods, such as cake, cookies, candy, chips, and soft drinks. How do I count carbohydrates? There are two ways to count carbohydrates in food. You can use either of the methods or a combination of both. Reading "Nutrition Facts" on packaged food The "Nutrition Facts" list is included on the labels of almost all packaged foods and beverages in the [place] It includes:  The serving size.  Information about nutrients in each serving, including the grams (g) of carbohydrate per serving. To use the "Nutrition Facts":  Decide how many servings you will have.  Multiply the number of servings by the number of carbohydrates per serving.  The resulting number is the total amount of carbohydrates that you will be having. Learning standard serving sizes of other foods When you eat carbohydrate foods that are not packaged or do not include "Nutrition Facts" on the label, you need to measure the servings in order to count the amount of carbohydrates:  Measure the foods that you will eat with a food scale or measuring cup, if needed.   Decide how many standard-size servings you will eat.  Multiply the number of servings by 15. Most carbohydrate-rich foods have about 15 g of carbohydrates per serving. ? For example, if you eat 8 oz (170 g) of strawberries, you will have eaten 2 servings and 30 g of carbohydrates (2 servings x 15 g = 30 g).  For foods that have more than one food mixed, such as soups and casseroles, you must count the carbohydrates in each food that is included. The following list contains standard serving sizes of common carbohydrate-rich foods. Each of these servings has about 15 g of carbohydrates:   hamburger bun or  English muffin.   oz (15 mL) syrup.   oz (14 g) jelly.  1 slice of bread.  1 six-inch tortilla.  3 oz (85 g) cooked rice or pasta.  4 oz (113 g) cooked dried beans.  4 oz (113 g) starchy vegetable, such as peas, corn, or potatoes.  4 oz (113 g) hot cereal.  4 oz (113 g) mashed potatoes or  of a large baked potato.  4 oz (113 g) canned or frozen fruit.  4 oz (120 mL) fruit juice.  4-6 crackers.  6 chicken nuggets.  6 oz (170 g) unsweetened dry cereal.  6 oz (170 g) plain fat-free yogurt or yogurt sweetened with artificial sweeteners.  8 oz (240 mL) milk.  8 oz (170 g) fresh fruit or one small piece of fruit.  24 oz (680 g) popped popcorn. Example of carbohydrate counting Sample meal  3 oz (85 g) chicken breast.  6 oz (170 g)   brown rice.  4 oz (113 g) corn.  8 oz (240 mL) milk.  8 oz (170 g) strawberries with sugar-free whipped topping. Carbohydrate calculation 1. Identify the foods that contain carbohydrates: ? Rice. ? Corn. ? Milk. ? Strawberries. 2. Calculate how many servings you have of each food: ? 2 servings rice. ? 1 serving corn. ? 1 serving milk. ? 1 serving strawberries. 3. Multiply each number of servings by 15 g: ? 2 servings rice x 15 g = 30 g. ? 1 serving corn x 15 g = 15 g. ? 1 serving milk x 15 g = 15 g. ? 1 serving  strawberries x 15 g = 15 g. 4. Add together all of the amounts to find the total grams of carbohydrates eaten: ? 30 g + 15 g + 15 g + 15 g = 75 g of carbohydrates total. Summary  Carbohydrate counting is a method of keeping track of how many carbohydrates you eat.  Eating carbohydrates naturally increases the amount of sugar (glucose) in the blood.  Counting how many carbohydrates you eat helps keep your blood glucose within normal limits, which helps you manage your diabetes.  A diet and nutrition specialist (registered dietitian) can help you make a meal plan and calculate how many carbohydrates you should have at each meal and snack. This information is not intended to replace advice given to you by your health care provider. Make sure you discuss any questions you have with your health care provider. Document Released: 03/15/2005 Document Revised: 10/07/2016 Document Reviewed: 08/27/2015 Elsevier Patient Education  2020 Elsevier Inc. DASH Eating Plan DASH stands for "Dietary Approaches to Stop Hypertension." The DASH eating plan is a healthy eating plan that has been shown to reduce high blood pressure (hypertension). It may also reduce your risk for type 2 diabetes, heart disease, and stroke. The DASH eating plan may also help with weight loss. What are tips for following this plan?  General guidelines  Avoid eating more than 2,300 mg (milligrams) of salt (sodium) a day. If you have hypertension, you may need to reduce your sodium intake to 1,500 mg a day.  Limit alcohol intake to no more than 1 drink a day for nonpregnant women and 2 drinks a day for men. One drink equals 12 oz of beer, 5 oz of wine, or 1 oz of hard liquor.  Work with your health care provider to maintain a healthy body weight or to lose weight. Ask what an ideal weight is for you.  Get at least 30 minutes of exercise that causes your heart to beat faster (aerobic exercise) most days of the week. Activities may  include walking, swimming, or biking.  Work with your health care provider or diet and nutrition specialist (dietitian) to adjust your eating plan to your individual calorie needs. Reading food labels   Check food labels for the amount of sodium per serving. Choose foods with less than 5 percent of the Daily Value of sodium. Generally, foods with less than 300 mg of sodium per serving fit into this eating plan.  To find whole grains, look for the word "whole" as the first word in the ingredient list. Shopping  Buy products labeled as "low-sodium" or "no salt added."  Buy fresh foods. Avoid canned foods and premade or frozen meals. Cooking  Avoid adding salt when cooking. Use salt-free seasonings or herbs instead of table salt or sea salt. Check with your health care provider or pharmacist before using salt substitutes.    Do not fry foods. Cook foods using healthy methods such as baking, boiling, grilling, and broiling instead.  Cook with heart-healthy oils, such as olive, canola, soybean, or sunflower oil. Meal planning  Eat a balanced diet that includes: ? 5 or more servings of fruits and vegetables each day. At each meal, try to fill half of your plate with fruits and vegetables. ? Up to 6-8 servings of whole grains each day. ? Less than 6 oz of lean meat, poultry, or fish each day. A 3-oz serving of meat is about the same size as a deck of cards. One egg equals 1 oz. ? 2 servings of low-fat dairy each day. ? A serving of nuts, seeds, or beans 5 times each week. ? Heart-healthy fats. Healthy fats called Omega-3 fatty acids are found in foods such as flaxseeds and coldwater fish, like sardines, salmon, and mackerel.  Limit how much you eat of the following: ? Canned or prepackaged foods. ? Food that is high in trans fat, such as fried foods. ? Food that is high in saturated fat, such as fatty meat. ? Sweets, desserts, sugary drinks, and other foods with added sugar. ? Full-fat  dairy products.  Do not salt foods before eating.  Try to eat at least 2 vegetarian meals each week.  Eat more home-cooked food and less restaurant, buffet, and fast food.  When eating at a restaurant, ask that your food be prepared with less salt or no salt, if possible. What foods are recommended? The items listed may not be a complete list. Talk with your dietitian about what dietary choices are best for you. Grains Whole-grain or whole-wheat bread. Whole-grain or whole-wheat pasta. Brown rice. Oatmeal. Quinoa. Bulgur. Whole-grain and low-sodium cereals. Pita bread. Low-fat, low-sodium crackers. Whole-wheat flour tortillas. Vegetables Fresh or frozen vegetables (raw, steamed, roasted, or grilled). Low-sodium or reduced-sodium tomato and vegetable juice. Low-sodium or reduced-sodium tomato sauce and tomato paste. Low-sodium or reduced-sodium canned vegetables. Fruits All fresh, dried, or frozen fruit. Canned fruit in natural juice (without added sugar). Meat and other protein foods Skinless chicken or turkey. Ground chicken or turkey. Pork with fat trimmed off. Fish and seafood. Egg whites. Dried beans, peas, or lentils. Unsalted nuts, nut butters, and seeds. Unsalted canned beans. Lean cuts of beef with fat trimmed off. Low-sodium, lean deli meat. Dairy Low-fat (1%) or fat-free (skim) milk. Fat-free, low-fat, or reduced-fat cheeses. Nonfat, low-sodium ricotta or cottage cheese. Low-fat or nonfat yogurt. Low-fat, low-sodium cheese. Fats and oils Soft margarine without trans fats. Vegetable oil. Low-fat, reduced-fat, or light mayonnaise and salad dressings (reduced-sodium). Canola, safflower, olive, soybean, and sunflower oils. Avocado. Seasoning and other foods Herbs. Spices. Seasoning mixes without salt. Unsalted popcorn and pretzels. Fat-free sweets. What foods are not recommended? The items listed may not be a complete list. Talk with your dietitian about what dietary choices are best  for you. Grains Baked goods made with fat, such as croissants, muffins, or some breads. Dry pasta or rice meal packs. Vegetables Creamed or fried vegetables. Vegetables in a cheese sauce. Regular canned vegetables (not low-sodium or reduced-sodium). Regular canned tomato sauce and paste (not low-sodium or reduced-sodium). Regular tomato and vegetable juice (not low-sodium or reduced-sodium). Pickles. Olives. Fruits Canned fruit in a light or heavy syrup. Fried fruit. Fruit in cream or butter sauce. Meat and other protein foods Fatty cuts of meat. Ribs. Fried meat. Bacon. Sausage. Bologna and other processed lunch meats. Salami. Fatback. Hotdogs. Bratwurst. Salted nuts and seeds. Canned beans with   added salt. Canned or smoked fish. Whole eggs or egg yolks. Chicken or turkey with skin. Dairy Whole or 2% milk, cream, and half-and-half. Whole or full-fat cream cheese. Whole-fat or sweetened yogurt. Full-fat cheese. Nondairy creamers. Whipped toppings. Processed cheese and cheese spreads. Fats and oils Butter. Stick margarine. Lard. Shortening. Ghee. Bacon fat. Tropical oils, such as coconut, palm kernel, or palm oil. Seasoning and other foods Salted popcorn and pretzels. Onion salt, garlic salt, seasoned salt, table salt, and sea salt. Worcestershire sauce. Tartar sauce. Barbecue sauce. Teriyaki sauce. Soy sauce, including reduced-sodium. Steak sauce. Canned and packaged gravies. Fish sauce. Oyster sauce. Cocktail sauce. Horseradish that you find on the shelf. Ketchup. Mustard. Meat flavorings and tenderizers. Bouillon cubes. Hot sauce and Tabasco sauce. Premade or packaged marinades. Premade or packaged taco seasonings. Relishes. Regular salad dressings. Where to find more information:  National Heart, Lung, and Blood Institute: www.nhlbi.nih.gov  American Heart Association: www.heart.org Summary  The DASH eating plan is a healthy eating plan that has been shown to reduce high blood pressure  (hypertension). It may also reduce your risk for type 2 diabetes, heart disease, and stroke.  With the DASH eating plan, you should limit salt (sodium) intake to 2,300 mg a day. If you have hypertension, you may need to reduce your sodium intake to 1,500 mg a day.  When on the DASH eating plan, aim to eat more fresh fruits and vegetables, whole grains, lean proteins, low-fat dairy, and heart-healthy fats.  Work with your health care provider or diet and nutrition specialist (dietitian) to adjust your eating plan to your individual calorie needs. This information is not intended to replace advice given to you by your health care provider. Make sure you discuss any questions you have with your health care provider. Document Released: 03/04/2011 Document Revised: 02/25/2017 Document Reviewed: 03/08/2016 Elsevier Patient Education  2020 Elsevier Inc.  

## 2018-10-26 ENCOUNTER — Telehealth: Payer: Self-pay | Admitting: Pharmacist

## 2018-10-26 NOTE — Telephone Encounter (Signed)
10/26/2018 8:51:32 AM - Advair 250/50 Refill to [facility]  10/26/2018 Received signed script for Advair 250/50 back from [facility], faxed to [facility] for processing refill.Delos Haring

## 2018-10-30 ENCOUNTER — Other Ambulatory Visit: Payer: Self-pay

## 2018-10-30 ENCOUNTER — Ambulatory Visit: Payer: Self-pay | Admitting: Gerontology

## 2018-10-30 DIAGNOSIS — R7303 Prediabetes: Secondary | ICD-10-CM

## 2018-10-30 DIAGNOSIS — R899 Unspecified abnormal finding in specimens from other organs, systems and tissues: Secondary | ICD-10-CM

## 2018-10-30 MED ORDER — METFORMIN HCL 1000 MG PO TABS: 500.0000 mg | ORAL_TABLET | Freq: Every day | ORAL | 3 refills | Status: DC
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
## 2018-10-30 NOTE — Patient Instructions (Signed)
Carbohydrate Counting for Diabetes Mellitus, Adult  Carbohydrate counting is a method of keeping track of how many carbohydrates you eat. Eating carbohydrates naturally increases the amount of sugar (glucose) in the blood. Counting how many carbohydrates you eat helps keep your blood glucose within normal limits, which helps you manage your diabetes (diabetes mellitus). It is important to know how many carbohydrates you can safely have in each meal. This is different for every person. A diet and nutrition specialist (registered dietitian) can help you make a meal plan and calculate how many carbohydrates you should have at each meal and snack. Carbohydrates are found in the following foods:  Grains, such as breads and cereals.  Dried beans and soy products.  Starchy vegetables, such as potatoes, peas, and corn.  Fruit and fruit juices.  Milk and yogurt.  Sweets and snack foods, such as cake, cookies, candy, chips, and soft drinks. How do I count carbohydrates? There are two ways to count carbohydrates in food. You can use either of the methods or a combination of both. Reading "Nutrition Facts" on packaged food The "Nutrition Facts" list is included on the labels of almost all packaged foods and beverages in the [place] It includes:  The serving size.  Information about nutrients in each serving, including the grams (g) of carbohydrate per serving. To use the "Nutrition Facts":  Decide how many servings you will have.  Multiply the number of servings by the number of carbohydrates per serving.  The resulting number is the total amount of carbohydrates that you will be having. Learning standard serving sizes of other foods When you eat carbohydrate foods that are not packaged or do not include "Nutrition Facts" on the label, you need to measure the servings in order to count the amount of carbohydrates:  Measure the foods that you will eat with a food scale or measuring cup, if needed.   Decide how many standard-size servings you will eat.  Multiply the number of servings by 15. Most carbohydrate-rich foods have about 15 g of carbohydrates per serving. ? For example, if you eat 8 oz (170 g) of strawberries, you will have eaten 2 servings and 30 g of carbohydrates (2 servings x 15 g = 30 g).  For foods that have more than one food mixed, such as soups and casseroles, you must count the carbohydrates in each food that is included. The following list contains standard serving sizes of common carbohydrate-rich foods. Each of these servings has about 15 g of carbohydrates:   hamburger bun or  English muffin.   oz (15 mL) syrup.   oz (14 g) jelly.  1 slice of bread.  1 six-inch tortilla.  3 oz (85 g) cooked rice or pasta.  4 oz (113 g) cooked dried beans.  4 oz (113 g) starchy vegetable, such as peas, corn, or potatoes.  4 oz (113 g) hot cereal.  4 oz (113 g) mashed potatoes or  of a large baked potato.  4 oz (113 g) canned or frozen fruit.  4 oz (120 mL) fruit juice.  4-6 crackers.  6 chicken nuggets.  6 oz (170 g) unsweetened dry cereal.  6 oz (170 g) plain fat-free yogurt or yogurt sweetened with artificial sweeteners.  8 oz (240 mL) milk.  8 oz (170 g) fresh fruit or one small piece of fruit.  24 oz (680 g) popped popcorn. Example of carbohydrate counting Sample meal  3 oz (85 g) chicken breast.  6 oz (170 g)   brown rice.  4 oz (113 g) corn.  8 oz (240 mL) milk.  8 oz (170 g) strawberries with sugar-free whipped topping. Carbohydrate calculation 1. Identify the foods that contain carbohydrates: ? Rice. ? Corn. ? Milk. ? Strawberries. 2. Calculate how many servings you have of each food: ? 2 servings rice. ? 1 serving corn. ? 1 serving milk. ? 1 serving strawberries. 3. Multiply each number of servings by 15 g: ? 2 servings rice x 15 g = 30 g. ? 1 serving corn x 15 g = 15 g. ? 1 serving milk x 15 g = 15 g. ? 1 serving  strawberries x 15 g = 15 g. 4. Add together all of the amounts to find the total grams of carbohydrates eaten: ? 30 g + 15 g + 15 g + 15 g = 75 g of carbohydrates total. Summary  Carbohydrate counting is a method of keeping track of how many carbohydrates you eat.  Eating carbohydrates naturally increases the amount of sugar (glucose) in the blood.  Counting how many carbohydrates you eat helps keep your blood glucose within normal limits, which helps you manage your diabetes.  A diet and nutrition specialist (registered dietitian) can help you make a meal plan and calculate how many carbohydrates you should have at each meal and snack. This information is not intended to replace advice given to you by your health care provider. Make sure you discuss any questions you have with your health care provider. Document Released: 03/15/2005 Document Revised: 10/07/2016 Document Reviewed: 08/27/2015 Elsevier Patient Education  2020 Elsevier Inc.  

## 2018-12-27 ENCOUNTER — Telehealth: Payer: Self-pay

## 2018-12-27 NOTE — Telephone Encounter (Signed)
Called pt on 9/30 at 2:08pm but pt does not have a vm box - pt needs an appt around 10/20 and to update eligibility

## 2019-03-12 ENCOUNTER — Other Ambulatory Visit: Payer: Self-pay | Admitting: Pharmacist

## 2019-03-21 ENCOUNTER — Telehealth: Payer: Self-pay | Admitting: Pharmacy Technician

## 2019-03-21 NOTE — Telephone Encounter (Signed)
Patient failed to provide requested 2020 financial documentation.  No additional medication assistance will be provided by [facility] without the required proof of income documentation.  Patient notified by letter.  Clester Chlebowski J. Devarion Mcclanahan Care Manager [facility] 
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
## 2019-07-07 ENCOUNTER — Emergency Department
Admission: EM | Admit: 2019-07-07 | Discharge: 2019-07-07 | Disposition: A | Discharge status: Home / Self Care | Payer: Self-pay | Attending: Student in an Organized Health Care Education/Training Program | Admitting: Student in an Organized Health Care Education/Training Program

## 2019-07-07 ENCOUNTER — Encounter: Payer: Self-pay | Admitting: Emergency Medicine

## 2019-07-07 ENCOUNTER — Emergency Department: Payer: Self-pay

## 2019-07-07 ENCOUNTER — Other Ambulatory Visit: Payer: Self-pay

## 2019-07-07 DIAGNOSIS — G8929 Other chronic pain: Secondary | ICD-10-CM | POA: Insufficient documentation

## 2019-07-07 DIAGNOSIS — Z7984 Long term (current) use of oral hypoglycemic drugs: Secondary | ICD-10-CM | POA: Insufficient documentation

## 2019-07-07 DIAGNOSIS — M25571 Pain in right ankle and joints of right foot: Secondary | ICD-10-CM | POA: Insufficient documentation

## 2019-07-07 DIAGNOSIS — R600 Localized edema: Secondary | ICD-10-CM | POA: Insufficient documentation

## 2019-07-07 DIAGNOSIS — I10 Essential (primary) hypertension: Secondary | ICD-10-CM | POA: Insufficient documentation

## 2019-07-07 DIAGNOSIS — Z79899 Other long term (current) drug therapy: Secondary | ICD-10-CM | POA: Insufficient documentation

## 2019-07-07 LAB — URINALYSIS, COMPLETE (UACMP) WITH MICROSCOPIC
Bacteria, UA: NONE SEEN
Bilirubin Urine: NEGATIVE
Glucose, UA: NEGATIVE mg/dL
Ketones, ur: NEGATIVE mg/dL
Leukocytes,Ua: NEGATIVE
Nitrite: NEGATIVE
Protein, ur: NEGATIVE mg/dL
Specific Gravity, Urine: 1.018 (ref 1.005–1.030)
pH: 5 (ref 5.0–8.0)

## 2019-07-07 MED ORDER — MELOXICAM 15 MG PO TABS: 15.0000 mg | ORAL_TABLET | Freq: Every day | ORAL | 0 refills | Status: AC

## 2019-07-07 MED ORDER — KETOROLAC TROMETHAMINE 30 MG/ML IJ SOLN
30.0000 mg | Freq: Once | INTRAMUSCULAR | Status: AC
  Administered 2019-07-07: 30 mg via INTRAMUSCULAR
  Filled 2019-07-07: qty 1

## 2019-07-07 NOTE — ED Triage Notes (Signed)
Pt to ER with c/o right ankle pain and swelling intermittent for last 2 years.  States has never been seen for this before.  States sometimes it is his left ankle.  States has been there for 2 days.

## 2019-07-07 NOTE — ED Provider Notes (Signed)
[facility] Emergency Department Provider Note  ____________________________________________   First MD Initiated Contact with Patient 07/07/19 1031     (approximate)  I have reviewed the triage vital signs and the nursing notes.   HISTORY  Chief Complaint Ankle Pain   HPI Gary Riley is a 59 y.o. male presents with complaint of right ankle pain and swelling intermittently for the last 2 years.  Patient states that there is been no prior injury.  He states that he has not taken any over-the-counter medication including Tylenol or ibuprofen.  This most recent episode started 2 days ago.  He also states that 2 years ago when he had similar symptoms the doctor he saw asked him about urinary symptoms which he denied but wants his urine checked today.  Currently he rates his pain as an 8 out of 10.      Past Medical History:  Diagnosis Date   Arthritis    Gout    Hypertension     Patient Active Problem List   Diagnosis Date Noted   Prediabetes 10/17/2018   Abnormal laboratory test result 09/07/2018   Hypertension 03/09/2018   Gout 03/09/2018   Arthritis 03/09/2018   Nasal congestion 03/09/2018   Dyspnea on exertion 01/17/2018   Chronic back pain 01/17/2018   Elevated blood pressure reading 01/17/2018    Past Surgical History:  Procedure Laterality Date   CHOLECYSTECTOMY      Prior to Admission medications   Medication Sig Start Date End Date Taking? Authorizing Provider  amLODipine (NORVASC) 5 MG tablet Take 2 tablets (10 mg total) by mouth daily. 07/12/18   Iloabachie, Chioma E, NP  Blood Pressure KIT 1 kit by Does not apply route every morning. 09/07/18   Iloabachie, Chioma E, NP  fluticasone (FLONASE) 50 MCG/ACT nasal spray Place 2 sprays into both nostrils daily. 10/17/18   Iloabachie, Chioma E, NP  Fluticasone-Salmeterol (ADVAIR DISKUS) 250-50 MCG/DOSE AEPB INHALE 1 PUFF INTO THE LUNGS 2 TIMES A DAY 10/17/18   Iloabachie,  Chioma E, NP  meloxicam (MOBIC) 15 MG tablet Take 1 tablet (15 mg total) by mouth daily. 07/07/19 07/06/20  Johnn Hai, PA-C  metFORMIN (GLUCOPHAGE) 1000 MG tablet Take 0.5 tablets (500 mg total) by mouth daily. 10/30/18   Iloabachie, Chioma E, NP  sodium chloride (OCEAN) 0.65 % SOLN nasal spray Place 1 spray into both nostrils as needed for congestion. 10/17/18   Iloabachie, Chioma E, NP    Allergies Patient has no known allergies.  Family History  Problem Relation Age of Onset   Hypertension Mother    Hypertension Father    Heart disease Father     Social History Social History   Tobacco Use   Smoking status: Never Smoker   Smokeless tobacco: Never Used  Substance Use Topics   Alcohol use: Not Currently    Comment: Occ.   Drug use: Never    Review of Systems Constitutional: No fever/chills Eyes: No visual changes. Cardiovascular: Denies chest pain. Respiratory: Denies shortness of breath. Gastrointestinal: No abdominal pain.  No nausea, no vomiting.  Genitourinary: Negative for dysuria. Musculoskeletal: Positive for chronic right ankle pain. Skin: Negative for rash. Neurological: Negative for headaches, focal weakness or numbness. ___________________________________________   PHYSICAL EXAM:  VITAL SIGNS: ED Triage Vitals [07/07/19 1002]  Enc Vitals Group     BP (!) 183/91     Pulse Rate 91     Resp 18     Temp 99 F (37.2 C)  Temp Source Oral     SpO2 96 %     Weight 220 lb (99.8 kg)     Height '5\' 8"'  (1.727 m)     Head Circumference      Peak Flow      Pain Score 8     Pain Loc      Pain Edu?      Excl. in [place]?     Constitutional: Alert and oriented. Well appearing and in no acute distress. Eyes: Conjunctivae are normal.  Head: Atraumatic. Neck: No stridor.   Cardiovascular: Normal rate, regular rhythm. Grossly normal heart sounds.  Good peripheral circulation. Respiratory: Normal respiratory effort.  No retractions. Lungs  CTAB. Musculoskeletal: Soft tissue edema and tenderness is noted on the lateral aspect of the right ankle.  There is also some soft tissue edema at the lateral tarsal area without evidence of bruising, discoloration or abrasions.  Skin is intact.  Pulses present.  Motor and sensory function intact.  Moves digits without any difficulty. Neurologic:  Normal speech and language. No gross focal neurologic deficits are appreciated. No gait instability. Skin:  Skin is warm, dry and intact. No rash noted. Psychiatric: Mood and affect are normal. Speech and behavior are normal.  ____________________________________________   LABS (all labs ordered are listed, but only abnormal results are displayed)  Labs Reviewed  URINALYSIS, COMPLETE (UACMP) WITH MICROSCOPIC - Abnormal; Notable for the following components:      Result Value   Color, Urine YELLOW (*)    APPearance CLEAR (*)    Hgb urine dipstick SMALL (*)    All other components within normal limits    RADIOLOGY   Official radiology report(s): DG Ankle Complete Right  Result Date: 07/07/2019 CLINICAL DATA:  Chronic right ankle pain and swelling for the past 2-3 years. History of arthritis and gout. No known injury. EXAM: RIGHT ANKLE - COMPLETE 3+ VIEW COMPARISON:  None. FINDINGS: Tiny ossicles adjacent to the distal tip of the medial malleolus likely represents sequela of remote avulsive injury. Mild soft tissue swelling about the ankle without associated acute fracture or dislocation. Joint spaces are preserved. The ankle mortise appears preserved. No definite ankle joint effusion. Enthesopathic change involving the Achilles tendon insertion site. IMPRESSION: 1. Mild soft tissue swelling about the ankle without associated acute fracture or dislocation. Punctate ossicle adjacent to the distal tip of the medial malleolus likely represents of remote avulsive injury. 2. Enthesopathic change involving the Achilles tendon insertion site.  Electronically Signed   By: Sandi Mariscal M.D.   On: 07/07/2019 11:28    ____________________________________________   PROCEDURES  Procedure(s) performed (including Critical Care):  Procedures   ____________________________________________   INITIAL IMPRESSION / ASSESSMENT AND PLAN / ED COURSE  As part of my medical decision making, I reviewed the following data within the electronic MEDICAL RECORD NUMBER Notes from prior ED visits and [place] Controlled Substance Database  59 year old male presents to the ED with complaint of right ankle pain and swelling intermittently for the last 2 years.  Patient states that he has not had an injury.  He is also not taken any over-the-counter medication for this.  X-rays were negative for any acute changes but showed soft tissue edema.  Patient does not have a primary care provider however we did discuss following up with a rheumatologist for further evaluation of his chronic ankle pain.  Physical exam is not consistent with gout at this time.  Patient was given Toradol 30 mg IM and a  prescription for meloxicam 15 mg 1 daily with food. ____________________________________________   FINAL CLINICAL IMPRESSION(S) / ED DIAGNOSES  Final diagnoses:  Chronic pain of right ankle     ED Discharge Orders         Ordered    meloxicam (MOBIC) 15 MG tablet  Daily     07/07/19 1158           Note:  This document was prepared using Dragon voice recognition software and may include unintentional dictation errors.    Johnn Hai, PA-C 07/07/19 1203    Merlyn Lot, MD 07/07/19 1320

## 2019-07-07 NOTE — Discharge Instructions (Addendum)
Call make an appointment with the rheumatology department at [facility] for further evaluation of your right ankle pain.  An injection of Toradol was given to you while in the ED.  Further test need to be done to evaluate your chronic ankle pain.  X-rays were negative for acute fracture but did show soft tissue edema.  Meloxicam 15 mg 1 daily with food was sent to your pharmacy at medication management.

## 2019-08-28 ENCOUNTER — Ambulatory Visit: Payer: Self-pay

## 2021-09-04 ENCOUNTER — Other Ambulatory Visit: Payer: Self-pay

## 2021-09-04 ENCOUNTER — Emergency Department
Admission: EM | Admit: 2021-09-04 | Discharge: 2021-09-04 | Disposition: A | Discharge status: Home / Self Care | Payer: Self-pay | Attending: Emergency Medicine | Admitting: Emergency Medicine

## 2021-09-04 DIAGNOSIS — H6121 Impacted cerumen, right ear: Secondary | ICD-10-CM | POA: Insufficient documentation

## 2021-09-04 DIAGNOSIS — R3912 Poor urinary stream: Secondary | ICD-10-CM | POA: Insufficient documentation

## 2021-09-04 DIAGNOSIS — R739 Hyperglycemia, unspecified: Secondary | ICD-10-CM | POA: Insufficient documentation

## 2021-09-04 DIAGNOSIS — R39198 Other difficulties with micturition: Secondary | ICD-10-CM

## 2021-09-04 LAB — CBC
HCT: 47.4 % (ref 39.0–52.0)
Hemoglobin: 16.1 g/dL (ref 13.0–17.0)
MCH: 29.9 pg (ref 26.0–34.0)
MCHC: 34 g/dL (ref 30.0–36.0)
MCV: 87.9 fL (ref 80.0–100.0)
Platelets: 212 10*3/uL (ref 150–400)
RBC: 5.39 MIL/uL (ref 4.22–5.81)
RDW: 12.2 % (ref 11.5–15.5)
WBC: 8.3 10*3/uL (ref 4.0–10.5)
nRBC: 0 % (ref 0.0–0.2)

## 2021-09-04 LAB — URINALYSIS, ROUTINE W REFLEX MICROSCOPIC
Bacteria, UA: NONE SEEN
Bilirubin Urine: NEGATIVE
Glucose, UA: NEGATIVE mg/dL
Ketones, ur: NEGATIVE mg/dL
Leukocytes,Ua: NEGATIVE
Nitrite: NEGATIVE
Protein, ur: NEGATIVE mg/dL
Specific Gravity, Urine: 1.019 (ref 1.005–1.030)
pH: 5 (ref 5.0–8.0)

## 2021-09-04 LAB — COMPREHENSIVE METABOLIC PANEL
ALT: 34 U/L (ref 0–44)
AST: 32 U/L (ref 15–41)
Albumin: 4.1 g/dL (ref 3.5–5.0)
Alkaline Phosphatase: 72 U/L (ref 38–126)
Anion gap: 9 (ref 5–15)
BUN: 12 mg/dL (ref 8–23)
CO2: 25 mmol/L (ref 22–32)
Calcium: 9.4 mg/dL (ref 8.9–10.3)
Chloride: 104 mmol/L (ref 98–111)
Creatinine, Ser: 1.1 mg/dL (ref 0.61–1.24)
GFR, Estimated: >60 mL/min (ref >60)
Glucose, Bld: 154 mg/dL — ABNORMAL HIGH (ref 70–99)
Potassium: 3.7 mmol/L (ref 3.5–5.1)
Sodium: 138 mmol/L (ref 135–145)
Total Bilirubin: 0.9 mg/dL (ref 0.3–1.2)
Total Protein: 7.5 g/dL (ref 6.5–8.1)

## 2021-09-04 MED ORDER — METFORMIN HCL 500 MG PO TABS: 500.0000 mg | ORAL_TABLET | Freq: Two times a day (BID) | ORAL | 11 refills | Status: AC

## 2021-09-04 MED ORDER — METFORMIN HCL 500 MG PO TABS
500.0000 mg | ORAL_TABLET | Freq: Once | ORAL | Status: AC
  Administered 2021-09-04: 500 mg via ORAL
  Filled 2021-09-04: qty 1

## 2021-09-04 MED ORDER — CARBAMIDE PEROXIDE 6.5 % OT SOLN
10.0000 [drp] | Freq: Once | OTIC | Status: AC
  Administered 2021-09-04: 10 [drp] via OTIC
  Filled 2021-09-04: qty 15

## 2021-09-04 NOTE — ED Triage Notes (Signed)
Pt states several years of lower abd pain and body "inferno". Pt denies known hematuria, discharge from penis and dysuria. Pt states is also having right ear "stopped up". Pt appears in no acute distress.

## 2021-09-05 NOTE — ED Provider Notes (Signed)
[facility] Provider Note   Event Date/Time   First MD Initiated Contact with Patient 09/04/21 2012     (approximate) History  Abdominal Pain  HPI Gary Riley is a 61 y.o. male with no stated past medical history who presents for multiple complaints including generalized body "burning sensation".  Patient also endorses hematuria and discharge from his penis as well and has decreased hearing in the right ear due to what he thinks is buildup of earwax.  Patient states that this burning sensation comes intermittently and in waves.  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 ROS: Patient currently denies any vision changes, tinnitus, difficulty speaking, facial droop, sore throat, chest pain, shortness of breath, abdominal pain, nausea/vomiting/diarrhea, or weakness/numbness in any extremity   Physical Exam  Triage Vital Signs: ED Triage Vitals  Enc Vitals Group     BP 09/04/21 1905 (!) 220/109     Pulse Rate 09/04/21 1903 (!) 106     Resp 09/04/21 1903 16     Temp 09/04/21 1903 98.8 F (37.1 C)     Temp Source 09/04/21 1903 Oral     SpO2 09/04/21 1903 95 %     Weight 09/04/21 1904 230 lb (104.3 kg)     Height 09/04/21 1904 5\' 8"  (1.727 m)     Head Circumference --      Peak Flow --      Pain Score 09/04/21 1904 5     Pain Loc --      Pain Edu? --      Excl. in [place]? --    Most recent vital signs: Vitals:   09/04/21 2130 09/04/21 2209  BP: (!) 166/100 (!) 179/98  Pulse: 81 84  Resp:  18  Temp:  98.4 F (36.9 C)  SpO2: 98% 96%   General: Awake, oriented x4. CV:  Good peripheral perfusion.  Resp:  Normal effort.  Abd:  No distention.  Other:  Middle-aged African-American overweight male sitting in bed in no acute distress.  There is a moderate amount of cerumen in the  right external auditory canal.  Genitourinary exam shows no obvious external abnormalities  ED Results / Procedures / Treatments  Labs (all labs ordered are listed, but only abnormal results are displayed) Labs Reviewed  COMPREHENSIVE METABOLIC PANEL - Abnormal; Notable for the following components:      Result Value   Glucose, Bld 154 (*)    All other components within normal limits  URINALYSIS, ROUTINE W REFLEX MICROSCOPIC - Abnormal; Notable for the following components:   Color, Urine YELLOW (*)    APPearance CLEAR (*)    Hgb urine dipstick SMALL (*)    All other components within normal limits  CBC   PROCEDURES: Critical Care performed: No .1-3 Lead EKG Interpretation  Performed by: [id number], MD Authorized by: Merwyn Katos, MD    MEDICATIONS ORDERED IN ED: Medications  carbamide peroxide (DEBROX) 6.5 % OTIC (EAR) solution 10 drop (10 drops Right EAR Given 09/04/21 2209)  metFORMIN (GLUCOPHAGE) tablet 500 mg (500 mg Oral Given 09/04/21 2208)   IMPRESSION / MDM / ASSESSMENT AND PLAN / ED COURSE  I reviewed the triage vital signs and the nursing notes.  The patient is on the cardiac monitor to evaluate for evidence of arrhythmia and/or significant heart rate changes. Patient's presentation is most consistent with acute presentation with potential threat to life or bodily function. Patients presentation most consistent with hyperglycemic state WITHOUT evidence of DKA. Given Exam, History, and Workup I have low suspicion for an emergent precipitating factor of this hyperglycemic state such as atypical MI, acute abdomen, or other serious bacterial illness. Patient is showing signs and symptoms of type 2 diabetes and encouraged patient to follow-up with his PCP for further care.  Will start metformin upon discharge  Findings: Patient without AGAP or significant ketones in urine to suggest DKA Patient also shows signs of moderate cerumen to the  right external auditory canal and will prescribe Debrox Patient's external genitourinary exam shows no evidence of acute abnormalities.  Patient encouraged to follow-up with his urologist Disposition: Discharge home with appropriate insulin regimen and prompt PCP follow up instructions.   FINAL CLINICAL IMPRESSION(S) / ED DIAGNOSES   Final diagnoses:  Hyperglycemia  Abnormal urinary stream  Hearing loss of right ear due to cerumen impaction   Rx / DC Orders   ED Discharge Orders          Ordered    metFORMIN (GLUCOPHAGE) 500 MG tablet  2 times daily with meals        09/04/21 2153           Note:  This document was prepared using Dragon voice recognition software and may include unintentional dictation errors.   Merwyn Katos, MD 09/05/21 [phone]

## 2023-07-02 DIAGNOSIS — R079 Chest pain, unspecified: Secondary | ICD-10-CM | POA: Insufficient documentation
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
## 2023-07-04 ENCOUNTER — Ambulatory Visit: Payer: Medicaid Other | Attending: Cardiovascular Disease | Admitting: Cardiovascular Disease

## 2023-07-04 ENCOUNTER — Encounter: Payer: Self-pay | Admitting: Cardiovascular Disease

## 2023-07-04 VITALS — BP 148/84 | HR 66 | Ht 68.0 in | Wt 218.0 lb

## 2023-07-04 DIAGNOSIS — R079 Chest pain, unspecified: Secondary | ICD-10-CM | POA: Diagnosis not present

## 2023-07-04 DIAGNOSIS — Z79899 Other long term (current) drug therapy: Secondary | ICD-10-CM

## 2023-07-04 DIAGNOSIS — I209 Angina pectoris, unspecified: Secondary | ICD-10-CM

## 2023-07-04 DIAGNOSIS — R0609 Other forms of dyspnea: Secondary | ICD-10-CM

## 2023-07-04 DIAGNOSIS — R7303 Prediabetes: Secondary | ICD-10-CM

## 2023-07-04 DIAGNOSIS — I10 Essential (primary) hypertension: Secondary | ICD-10-CM

## 2023-07-04 DIAGNOSIS — R072 Precordial pain: Secondary | ICD-10-CM

## 2023-07-04 MED ORDER — METOPROLOL TARTRATE 100 MG PO TABS: 100.0000 mg | ORAL_TABLET | Freq: Once | ORAL | 0 refills | Status: AC

## 2023-07-04 NOTE — Patient Instructions (Addendum)
 Medication Instructions:   No changes  Take Metoprolol Tartrate 100 MG once 2 hours prior to Cardiac CT.  If you need a refill on your cardiac medications before your next appointment, please call your pharmacy.   Lab work: No new labs needed  Testing/Procedures:  Your physician has requested that you have an echocardiogram. Echocardiography is a painless test that uses sound waves to create images of your heart. It provides your doctor with information about the size and shape of your heart and how well your heart's chambers and valves are working.   You may receive an ultrasound enhancing agent through an IV if needed to better visualize your heart during the echo. This procedure takes approximately one hour.  There are no restrictions for this procedure.  This will take place at [address] [facility]) [address], [place] [address]  Please note: We ask at that you not bring children with you during ultrasound (echo/ vascular) testing. Due to room size and safety concerns, children are not allowed in the ultrasound rooms during exams. Our front office staff cannot provide observation of children in our lobby area while testing is being conducted. An adult accompanying a patient to their appointment will only be allowed in the ultrasound room at the discretion of the ultrasound technician under special circumstances. We apologize for any inconvenience.      Your cardiac CT will be scheduled at one of the below locations:    [facility] [address] [place], [place] [address] [phone]  OR   [facility] [address] [place], [place] [address] [phone]   If scheduled at [facility] or [facility], please arrive 15 mins early for check-in and test prep.  There is spacious parking and easy access to the radiology department from the [facility]  Heart and Vascular entrance. Please enter here and check-in with the desk attendant.   Please follow these instructions carefully (unless otherwise directed):  An IV will be required for this test and Nitroglycerin will be given.  Hold all erectile dysfunction medications at least 3 days (72 hrs) prior to test. (Ie viagra, cialis, sildenafil, tadalafil, etc)   On the Night Before the Test: Be sure to Drink plenty of water. Do not consume any caffeinated/decaffeinated beverages or chocolate 12 hours prior to your test. Do not take any antihistamines 12 hours prior to your test.   On the Day of the Test: Drink plenty of water until 1 hour prior to the test. Do not eat any food 1 hour prior to test. You may take your regular medications prior to the test.  Take metoprolol (Lopressor) 100 MG two hours prior to test. If you take Furosemide/Hydrochlorothiazide/Spironolactone/Chlorthalidone, please HOLD on the morning of the test. Patients who wear a continuous glucose monitor MUST remove the device prior to scanning.      After the Test: Drink plenty of water. After receiving IV contrast, you may experience a mild flushed feeling. This is normal. On occasion, you may experience a mild rash up to 24 hours after the test. This is not dangerous. If this occurs, you can take Benadryl 25 mg, Zyrtec, Claritin, or Allegra and increase your fluid intake. (Patients taking Tikosyn should avoid Benadryl, and may take Zyrtec, Claritin, or Allegra) If you experience trouble breathing, this can be serious. If it is severe call 911 IMMEDIATELY. If it is mild, please call our office.  We  will call to schedule your test 2-4 weeks out understanding that some insurance companies will need an authorization prior to the service being performed.   For more information and frequently asked questions, please visit our website : [email or web address]  For non-scheduling related questions, please contact  the cardiac imaging nurse navigator should you have any questions/concerns: Cardiac Imaging Nurse Navigators Direct Office Dial: [phone]   For scheduling needs, including cancellations and rescheduling, please call [place], [phone].   Follow-Up: At [facility], you and your health needs are our priority.  As part of our continuing mission to provide you with exceptional heart care, we have created designated Provider Care Teams.  These Care Teams include your primary Cardiologist (physician) and Advanced Practice Providers (APPs -  Physician Assistants and Nurse Practitioners) who all work together to provide you with the care you need, when you need it.  Follow up as needed.   Providers on your designated Care Team:   Nicolasa Ducking, NP Eula Listen, PA-C Cadence Fransico Michael, [place]  COVID-19 Vaccine Information can be found at: [email or web address] For questions related to vaccine distribution or appointments, please email [email or web address] or call [phone].

## 2023-07-05 LAB — BASIC METABOLIC PANEL WITH GFR
BUN/Creatinine Ratio: 9 — ABNORMAL LOW (ref 10–24)
BUN: 11 mg/dL (ref 8–27)
CO2: 21 mmol/L (ref 20–29)
Calcium: 9.5 mg/dL (ref 8.6–10.2)
Chloride: 101 mmol/L (ref 96–106)
Creatinine, Ser: 1.2 mg/dL (ref 0.76–1.27)
Glucose: 109 mg/dL — ABNORMAL HIGH (ref 70–99)
Potassium: 5.3 mmol/L — ABNORMAL HIGH (ref 3.5–5.2)
Sodium: 138 mmol/L (ref 134–144)
eGFR: 68 mL/min/{1.73_m2} (ref >59)

## 2023-07-06 ENCOUNTER — Encounter: Payer: Self-pay | Admitting: Emergency Medicine

## 2023-07-13 ENCOUNTER — Telehealth (HOSPITAL_COMMUNITY): Payer: Self-pay | Admitting: *Deleted

## 2023-07-13 NOTE — Telephone Encounter (Signed)
 Reaching out to patient to offer assistance regarding upcoming cardiac imaging study; pt verbalizes understanding of appt date/time, parking situation and where to check in, pre-test NPO status and medications ordered, and verified current allergies; name and call back number provided for further questions should they arise  Larey Brick RN Navigator Cardiac Imaging [facility] [phone] office [phone] cell  Patient to take 100mg  metoprolol tartrate two hours prior to his cardiac CT scan.

## 2023-07-14 ENCOUNTER — Ambulatory Visit
Admission: RE | Admit: 2023-07-14 | Discharge: 2023-07-14 | Disposition: A | Discharge status: Home / Self Care | Source: Ambulatory Visit | Attending: Cardiovascular Disease | Admitting: Cardiovascular Disease

## 2023-07-14 DIAGNOSIS — R072 Precordial pain: Secondary | ICD-10-CM | POA: Diagnosis present

## 2023-07-14 MED ORDER — IOHEXOL 350 MG/ML SOLN
100.0000 mL | Freq: Once | INTRAVENOUS | Status: AC | PRN
  Administered 2023-07-14: 100 mL via INTRAVENOUS

## 2023-07-14 MED ORDER — NITROGLYCERIN 0.4 MG SL SUBL: 0.8000 mg | SUBLINGUAL_TABLET | Freq: Once | SUBLINGUAL | Status: DC

## 2023-07-14 MED ORDER — NITROGLYCERIN 0.4 MG SL SUBL
0.8000 mg | SUBLINGUAL_TABLET | Freq: Once | SUBLINGUAL | Status: AC
  Administered 2023-07-14: 0.8 mg via SUBLINGUAL

## 2023-07-14 NOTE — Progress Notes (Signed)
Pt tolerated procedure well with no issues. Pt ABCs intact. Pt denies any complaints. Pt encouraged to drink plenty of water throughout the day. Pt ambulatory with steady gait.

## 2023-07-25 ENCOUNTER — Encounter: Payer: Self-pay | Admitting: Emergency Medicine

## 2023-07-28 ENCOUNTER — Ambulatory Visit: Attending: Cardiovascular Disease

## 2023-07-28 DIAGNOSIS — R0609 Other forms of dyspnea: Secondary | ICD-10-CM | POA: Insufficient documentation

## 2023-07-28 LAB — ECHOCARDIOGRAM COMPLETE
AR max vel: 2.84 cm2
AV Area VTI: 2.66 cm2
AV Area mean vel: 2.5 cm2
AV Mean grad: 5 mmHg
AV Peak grad: 8.6 mmHg
Ao pk vel: 1.47 m/s
Area-P 1/2: 3.65 cm2
S' Lateral: 2.69 cm

## 2023-08-01 ENCOUNTER — Encounter: Payer: Self-pay | Admitting: Emergency Medicine
